# Patient Record
Sex: Female | Born: 1980 | Hispanic: Yes | Marital: Married | Smoking: Never smoker
Health system: Southern US, Community
[De-identification: ages and names within clinical notes are randomized; demographics above are authoritative.]

## PROBLEM LIST (undated history)

## (undated) ENCOUNTER — Inpatient Hospital Stay (HOSPITAL_COMMUNITY): Payer: Self-pay

## (undated) DIAGNOSIS — I10 Essential (primary) hypertension: Secondary | ICD-10-CM

## (undated) DIAGNOSIS — J40 Bronchitis, not specified as acute or chronic: Secondary | ICD-10-CM

## (undated) HISTORY — PX: NO PAST SURGERIES: SHX2092

## (undated) HISTORY — DX: Bronchitis, not specified as acute or chronic: J40

## (undated) HISTORY — DX: Essential (primary) hypertension: I10

---

## 2002-05-19 ENCOUNTER — Encounter: Payer: Self-pay | Admitting: Obstetrics & Gynecology

## 2002-05-19 ENCOUNTER — Ambulatory Visit (HOSPITAL_COMMUNITY): Admission: RE | Admit: 2002-05-19 | Discharge: 2002-05-19 | Payer: Self-pay | Admitting: Obstetrics & Gynecology

## 2002-08-19 ENCOUNTER — Inpatient Hospital Stay (HOSPITAL_COMMUNITY): Admission: AD | Admit: 2002-08-19 | Discharge: 2002-08-22 | Payer: Self-pay | Admitting: Internal Medicine

## 2006-10-05 ENCOUNTER — Inpatient Hospital Stay (HOSPITAL_COMMUNITY): Admission: AD | Admit: 2006-10-05 | Discharge: 2006-10-07 | Payer: Self-pay | Admitting: Obstetrics and Gynecology

## 2008-09-06 ENCOUNTER — Ambulatory Visit: Payer: Self-pay | Admitting: Family

## 2008-09-06 ENCOUNTER — Inpatient Hospital Stay (HOSPITAL_COMMUNITY): Admission: AD | Admit: 2008-09-06 | Discharge: 2008-09-09 | Payer: Self-pay | Admitting: Obstetrics & Gynecology

## 2011-04-18 NOTE — H&P (Signed)
   NAMENoel Thornton                    ACCOUNT NO.:  [id number]   MEDICAL RECORD NO.:  [id number]                   PATIENT TYPE:  INP   LOCATION:  A417                                 FACILITY:  [facility]   PHYSICIAN:  Tilda Burrow, M.D.              DATE OF BIRTH:  1981/11/02   DATE OF ADMISSION:  08/19/2002  DATE OF DISCHARGE:                                HISTORY & PHYSICAL   ADMISSION DIAGNOSIS:  Pregnancy, 40-1/2 weeks' gestation, latent phase  labor.   HISTORY OF PRESENT ILLNESS:  This 29 year old Hispanic female, gravida 1,  para 0, LMP unknown with ultrasound-assigned EDC of 08/14/02 based on a late  second trimester ultrasound, is admitted after a pregnancy course followed  only since June through our office after initial presentation at 27 weeks by  ultrasound criteria.  She presents with mild contractions, cervical dilation  to 2 cm, 75% effaced, -1 to -2 station, and vertex presentation, the cervix  slightly deviated to the left.  Vertex presentation confirmed, membranes  intact.  The patient is admitted for labor management.   Prenatal course followed x3 months with a seven-pound weight gain,  appropriate fundal height growth.   PAST MEDICAL HISTORY:  Allegedly quite benign.  Communication is  challenging, as patient and family completely speak Spanish only.  Past  medical history benign.   PAST SURGICAL HISTORY:  Negative.   ALLERGIES:  No known drug allergies.   PRENATAL LABORATORY DATA:  Blood type O positive, rubella immunity present.  Hemoglobin 11, hematocrit 33.  Hepatitis, HIV, RPR, GC, Chlamydia all  negative.  Group B strep negative.  Glucose tolerance test 98 mg%.   PHYSICAL EXAMINATION:  VITAL SIGNS:  Approximately 5 feet, weight 129, blood  pressure 120/80.  ABDOMEN:  Fundal height 36 cm, estimated fetal weight 6 pounds.  PELVIC:  Cervix 2 cm, 80%, -2, deep, and cervix deviated to the left when  examined by me shortly after midnight.   PLAN:  Admit, amniotomy.  May require Pitocin augmentation of labor.  Good  prognosis for vaginal delivery.                                               Tilda Burrow, M.D.    JVF/MEDQ  D:  08/20/2002  T:  08/20/2002  Job:  [id number]

## 2011-04-18 NOTE — H&P (Signed)
NAMEClent Thornton        ACCOUNT NO.:  [id number]   MEDICAL RECORD NO.:  [id number]          PATIENT TYPE:  INP   LOCATION:  LDR4                          FACILITY:  [facility]   PHYSICIAN:  Desiree Thornton, M.D. DATE OF BIRTH:  [birth year]   DATE OF ADMISSION:  10/05/2006  DATE OF DISCHARGE:  LH                                HISTORY & PHYSICAL   REASON FOR ADMISSION:  Pregnancy at 41 weeks and 2 days, early labor.   HISTORY OF PRESENT ILLNESS:  Desiree Thornton is admitted having irregular uterine  contractions and watched during the course of the night, which contractions  have progressively gotten stronger.  She is now, at 7:30, 4-5 cm, completely  effaced and -1 to 0 station.   ALLERGIES:  NO KNOWN DRUG ALLERGIES.   PAST MEDICAL HISTORY:  Negative.   PAST SURGICAL HISTORY:  Negative.   FAMILY HISTORY:  Benign.   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   Blood type is O positive.  UDS is negative.  Rubella is immune.  Hepatitis B  surface antigen negative.  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  Serology is  nonreactive. Pap normal. GC and Chlamydia on both cultures are negative.  GBS is negative.  A 28-week hemoglobin 11.3, a 28-week hematocrit is 35.  One hour glucose was 81.   PHYSICAL EXAMINATION:  Vital signs are stable.  Fetal heart rate pattern is  stable with reactivity.  Contractions are every two minutes, moderate to  strong intensity.  She is 4-5 cm, completely effaced, -1 to 0 station.  Also  perineum was inspected prior to rupture and no noted lesions as far as  herpes is observed.  __scalp electrode________ was applied.   PLAN:  Expect vaginal delivery.      Desiree Thornton, Desiree Thornton      Desiree Thornton, M.D.  Electronically Signed    DL/MEDQ  D:  34/74/2595  T:  10/06/2006  Job:  [id number]   cc:   Desiree Picket A. Gerda Diss, MD  Fax: [phone]

## 2011-04-18 NOTE — Group Therapy Note (Signed)
NAMELuanna Cole NO.:  [id number]   MEDICAL RECORD NO.:  [id number]          PATIENT TYPE:  INP   LOCATION:  A401                          FACILITY:  [facility]   PHYSICIAN:  Tilda Burrow, M.D. DATE OF BIRTH:  11/07/1981   DATE OF PROCEDURE:  DATE OF DISCHARGE:                                   PROGRESS NOTE   DELIVERY NOTE:  I checked Lyndsey at approximately 0850, and she was noted  to be fully dilated at zero to +1 station.  Her husband had gone to take her  little boy to school, so she did not want to push until he got back.  About  30 minutes later amazingly he did come back and she began to push.  After a  brief second stage she had a spontaneous vaginal delivery of a viable female  infant at 16.  The mouth and nose were suctioned on the perineum, and the  body delivered very rapidly.  Weight is 7 pounds 0 ounces, Apgars are 9 and  9.  Twenty units of Pitocin diluted in 1000 mL of lactated Ringer's was  infused rapidly IV.  The placenta separated spontaneously and delivered via  controlled cord traction at 0940.  It was inspected and appeared to be  intact with a three-vessel cord.  Estimated blood loss 200 mL.  The vagina  was inspected and no lacerations were found.      Jacklyn Shell, C.N.M.      Tilda Burrow, M.D.  Electronically Signed    FC/MEDQ  D:  10/06/2006  T:  10/07/2006  Job:  [id number]   cc:   [facility]   Dr. Gerda Diss

## 2011-04-18 NOTE — Op Note (Signed)
   NAMENoel Thornton                    ACCOUNT NO.:  [id number]   MEDICAL RECORD NO.:  [id number]                   PATIENT TYPE:  INP   LOCATION:  A417                                 FACILITY:  [facility]   PHYSICIAN:  Tilda Burrow, M.D.              DATE OF BIRTH:  December 10, 1980   DATE OF PROCEDURE:  DATE OF DISCHARGE:                                 OPERATIVE REPORT   LABOR SUMMARY AND DELIVERY NOTE:   TIME OF DELIVERY:  7:37 a.m.   SURGEON:  Tilda Burrow, M.D.   DESCRIPTION OF PROCEDURE:  The patient progressed slowly in labor.  Contraction intensity was considered inadequate for a prolonged latent phase  where at 2 a.m. There was no change in her cervix.  Contractions were  frequent but indentable.  Pitocin augmentation of labor was initiated.  She  progressed steadily through the night and required IV analgesics with  Phenergan and Nubain and progressed to completely dilated at 6:30 p.m.  She  pushed through a second stage of just slightly less than an hour and  delivered at apparatus 7:37 a.m. over an intact perineum, delivering a  healthy appearing female infant.  Apgars 9 and 9, weight 7 pounds 5.5 ounces  (3350 grams).  Amniotic fluid was clear.  The patient was delivered over an  intact perineum with no lacerations occurring.  Placental cord blood samples  were obtained and then the placenta delivered [facility] presentation.  There  was a tendency towards excess uterine bleeding with approximately 750 cc  blood loss primarily occurring with delivery of the placenta, but she had 1  episode of uterine atony shortly after delivery of the placenta which  responded to intramuscular Hemabate to 125 mcg.  The patient then remained  stable with labor attended by the patient's husband who was supportive.                                               Tilda Burrow, M.D.    JVF/MEDQ  D:  08/20/2002  T:  08/22/2002  Job:  [id number]   cc:   Dr. [facility]

## 2011-09-02 LAB — DIFFERENTIAL
Basophils Absolute: 0.1
Basophils Relative: 1
Eosinophils Absolute: 0.1
Eosinophils Relative: 1
Lymphocytes Relative: 25
Lymphs Abs: 2.8
Monocytes Absolute: 0.7
Monocytes Relative: 7
Neutro Abs: 7.4
Neutrophils Relative %: 66

## 2011-09-02 LAB — CBC
HCT: 35.3 — ABNORMAL LOW
Hemoglobin: 11.4 — ABNORMAL LOW
MCHC: 32.4
MCV: 84.6
Platelets: 300
RBC: 4.18
RDW: 15.9 — ABNORMAL HIGH
WBC: 11.1 — ABNORMAL HIGH

## 2011-09-02 LAB — RUBELLA SCREEN: Rubella: 32.9 — ABNORMAL HIGH

## 2011-09-02 LAB — ABO/RH: ABO/RH(D): O POS

## 2011-09-02 LAB — RPR: RPR Ser Ql: NONREACTIVE

## 2011-09-02 LAB — HEPATITIS B SURFACE ANTIGEN: Hepatitis B Surface Ag: NEGATIVE

## 2013-04-14 ENCOUNTER — Ambulatory Visit (HOSPITAL_COMMUNITY)
Admission: RE | Admit: 2013-04-14 | Discharge: 2013-04-14 | Disposition: A | Discharge status: Home / Self Care | Payer: Self-pay | Source: Ambulatory Visit | Attending: Family Medicine | Admitting: Family Medicine

## 2013-04-14 ENCOUNTER — Other Ambulatory Visit (HOSPITAL_COMMUNITY): Payer: Self-pay | Admitting: Nurse Practitioner

## 2013-04-14 DIAGNOSIS — R059 Cough, unspecified: Secondary | ICD-10-CM | POA: Insufficient documentation

## 2013-04-14 DIAGNOSIS — R05 Cough: Secondary | ICD-10-CM

## 2013-09-04 ENCOUNTER — Inpatient Hospital Stay (HOSPITAL_COMMUNITY): Payer: Self-pay

## 2013-09-04 ENCOUNTER — Inpatient Hospital Stay (HOSPITAL_COMMUNITY)
Admission: AD | Admit: 2013-09-04 | Discharge: 2013-09-04 | Disposition: A | Discharge status: Home / Self Care | Payer: Self-pay | Source: Ambulatory Visit | Attending: Obstetrics & Gynecology | Admitting: Obstetrics & Gynecology

## 2013-09-04 ENCOUNTER — Encounter (HOSPITAL_COMMUNITY): Payer: Self-pay | Admitting: *Deleted

## 2013-09-04 DIAGNOSIS — O039 Complete or unspecified spontaneous abortion without complication: Secondary | ICD-10-CM | POA: Insufficient documentation

## 2013-09-04 LAB — POCT PREGNANCY, URINE: Preg Test, Ur: POSITIVE — AB

## 2013-09-04 LAB — URINE MICROSCOPIC-ADD ON

## 2013-09-04 LAB — CBC
MCH: 30 pg (ref 26.0–34.0)
Platelets: 304 10*3/uL (ref 150–400)
RBC: 4.43 MIL/uL (ref 3.87–5.11)
WBC: 6.8 10*3/uL (ref 4.0–10.5)

## 2013-09-04 LAB — URINALYSIS, ROUTINE W REFLEX MICROSCOPIC
Bilirubin Urine: NEGATIVE
Ketones, ur: NEGATIVE mg/dL
Protein, ur: NEGATIVE mg/dL
Urobilinogen, UA: 0.2 mg/dL (ref 0.0–1.0)

## 2013-09-04 LAB — WET PREP, GENITAL: Clue Cells Wet Prep HPF POC: NONE SEEN

## 2013-09-04 LAB — HCG, QUANTITATIVE, PREGNANCY: hCG, Beta Chain, Quant, S: 2683 m[IU]/mL — ABNORMAL HIGH (ref <5)

## 2013-09-04 NOTE — MAU Note (Signed)
Pt presents with complaints of vaginal bleeding since this morning around 4 am. States some abdominal cramping

## 2013-09-04 NOTE — MAU Provider Note (Signed)
History     CSN: [id number]  Arrival date and time: 09/04/13 1227   None     Chief Complaint  Patient presents with  . Vaginal Bleeding   HPI 32 y.o. G4P3 at [redacted]w[redacted]d by LMP with bleeding since 9 am, mild pain in low abd and left side. Found out she was pregnant about 2 months ago.   History reviewed. No pertinent past medical history.  History reviewed. No pertinent past surgical history.  No family history on file.  History  Substance Use Topics  . Smoking status: Not on file  . Smokeless tobacco: Not on file  . Alcohol Use: Not on file    Allergies: Allergies not on file  No prescriptions prior to admission    Review of Systems  Constitutional: Negative.   Respiratory: Negative.   Cardiovascular: Negative.   Gastrointestinal: Positive for abdominal pain. Negative for nausea, vomiting, diarrhea and constipation.  Genitourinary: Negative for dysuria, urgency, frequency, hematuria and flank pain.       + bleeding  Musculoskeletal: Negative.   Neurological: Negative.   Psychiatric/Behavioral: Negative.    Physical Exam   Blood pressure 134/85, pulse 72, temperature 98.2 F (36.8 C), temperature source Oral, resp. rate 16, height 4\' 11"  (1.499 m), weight 138 lb (62.596 kg), last menstrual period 06/03/2013.  Physical Exam  Nursing note and vitals reviewed. Constitutional: She is oriented to person, place, and time. She appears well-developed and well-nourished. No distress.  HENT:  Head: Normocephalic and atraumatic.  Cardiovascular: Normal rate.   Respiratory: Effort normal. No respiratory distress.  GI: Soft. She exhibits no distension and no mass. There is no tenderness. There is no rebound and no guarding.  Genitourinary: There is no rash or lesion on the right labia. There is no rash or lesion on the left labia. Uterus is not deviated, not fixed and not tender. Cervix exhibits no motion tenderness, no discharge and no friability. Right adnexum displays no  mass, no tenderness and no fullness. Left adnexum displays no mass, no tenderness and no fullness. There is bleeding (small) around the vagina. No erythema or tenderness around the vagina. No vaginal discharge found.  Exam limited by body habitus. Cervix closed  Neurological: She is alert and oriented to person, place, and time.  Skin: Skin is warm and dry.  Psychiatric: She has a normal mood and affect.   Unable to doppler FHT.   MAU Course  Procedures  Results for orders placed during the hospital encounter of 09/04/13 (from the past 24 hour(s))  URINALYSIS, ROUTINE W REFLEX MICROSCOPIC     Status: Abnormal   Collection Time    09/04/13 12:45 PM      Result Value Range   Color, Urine RED (*) YELLOW   APPearance CLEAR  CLEAR   Specific Gravity, Urine <1.005 (*) 1.005 - 1.030   pH 6.5  5.0 - 8.0   Glucose, UA NEGATIVE  NEGATIVE mg/dL   Hgb urine dipstick LARGE (*) NEGATIVE   Bilirubin Urine NEGATIVE  NEGATIVE   Ketones, ur NEGATIVE  NEGATIVE mg/dL   Protein, ur NEGATIVE  NEGATIVE mg/dL   Urobilinogen, UA 0.2  0.0 - 1.0 mg/dL   Nitrite NEGATIVE  NEGATIVE   Leukocytes, UA NEGATIVE  NEGATIVE  URINE MICROSCOPIC-ADD ON     Status: Abnormal   Collection Time    09/04/13 12:45 PM      Result Value Range   Squamous Epithelial / LPF MANY (*) RARE   WBC, UA 3-6  <  3 WBC/hpf   RBC / HPF 21-50  <3 RBC/hpf   Bacteria, UA RARE  RARE   Urine-Other MUCOUS PRESENT    POCT PREGNANCY, URINE     Status: Abnormal   Collection Time    09/04/13 12:52 PM      Result Value Range   Preg Test, Ur POSITIVE (*) NEGATIVE  CBC     Status: None   Collection Time    09/04/13  1:15 PM      Result Value Range   WBC 6.8  4.0 - 10.5 K/uL   RBC 4.43  3.87 - 5.11 MIL/uL   Hemoglobin 13.3  12.0 - 15.0 g/dL   HCT 09.8  11.9 - 14.7 %   MCV 87.8  78.0 - 100.0 fL   MCH 30.0  26.0 - 34.0 pg   MCHC 34.2  30.0 - 36.0 g/dL   RDW 82.9  56.2 - 13.0 %   Platelets 304  150 - 400 K/uL    US Ob Comp Less 14  Wks  09/04/2013   *RADIOLOGY REPORT*  Clinical Data: Vaginal bleeding, last menstrual period - 06/03/2013; evaluate for ectopic pregnancy; B-HCG - 2600  OBSTETRIC <14 WK ULTRASOUND  Technique:  Transabdominal ultrasound was performed for evaluation of the gestation as well as the maternal uterus and adnexal regions.  Comparison:  None.  Findings:  Intrauterine gestational sac: Single normal appearing intrauterine gestational sac Yolk sac: Not visualized Embryo: Visualized Cardiac Activity: Not visualized  CRL:  24 mm  9 w  2 d  Maternal uterus/Adnexae:  Right ovary:  Normal in size measuring 3.1 x 1.4 x 2.0 cm.  No discrete right-sided ovarian or adnexal mass.  Left ovary:  Normal in size measuring approximately 0.2 x 2.8 x 3.1 cm.  The left ovary demonstrates increased echogenicity possibly representative of a dermoid.  No free fluid in the pelvic cul-de-sac.  IMPRESSION: 1.  Single intrauterine gestation with crown rump length compatible with a 9-week, 2-day gestation, however no identifiable cardiac activity or heart rate - findings worrisome for failed intrauterine gestation. 2.  Abnormal diffuse increased echogenicity of a normal sized left ovary worrisome for the possibility of a left ovarian dermoid. Further evaluation may be obtained with a followup pelvic ultrasound in 4 to 6 weeks or with a non emergent pelvic MRI.  Critical Value/emergent results were called by telephone at the time of interpretation on 09/04/2013 at 40 27 to Dr. Bascom Levels, who verbally acknowledged these results.   Original Report Authenticated By: Tacey Ruiz, MD   Assessment and Plan   1. SAB (spontaneous abortion)   Counseled patient on options - expectant mgmt vs. cytotec - pt elects expectant mgmt at this time, will f/u in [facility] in 1 week. Rev'd precautions.     Medication List         prenatal multivitamin Tabs tablet  Take 1 tablet by mouth daily at 12 noon.            Follow-up Information   Follow up with  [facility]. (The clinic will call you to schedule an appointment )    Specialty:  Obstetrics and Gynecology   Contact information:   [address] [place] [address] [phone]       [facility] 09/04/2013, 1:08 PM

## 2013-09-05 LAB — GC/CHLAMYDIA PROBE AMP: GC Probe RNA: NEGATIVE

## 2013-09-05 NOTE — MAU Provider Note (Signed)
Attestation of Attending Supervision of Advanced Practitioner (CNM/NP): Evaluation and management procedures were performed by the Advanced Practitioner under my supervision and collaboration. I have reviewed the Advanced Practitioner's note and chart, and I agree with the management and plan.  Vallie Fayette H. 7:12 AM   

## 2013-09-15 ENCOUNTER — Encounter: Payer: Self-pay | Admitting: Family Medicine

## 2013-09-15 ENCOUNTER — Ambulatory Visit (INDEPENDENT_AMBULATORY_CARE_PROVIDER_SITE_OTHER): Payer: Self-pay | Admitting: Family Medicine

## 2013-09-15 VITALS — BP 134/90 | HR 89 | Temp 97.7°F | Ht 59.0 in | Wt 139.6 lb

## 2013-09-15 DIAGNOSIS — O039 Complete or unspecified spontaneous abortion without complication: Secondary | ICD-10-CM

## 2013-09-15 DIAGNOSIS — N83209 Unspecified ovarian cyst, unspecified side: Secondary | ICD-10-CM

## 2013-09-15 NOTE — Patient Instructions (Signed)
Aborto espontneo  (Miscarriage) El aborto espontneo es la prdida de un beb que no ha nacido (feto) antes de la semana 20 del Media planner. La mayor parte de estos abortos ocurre en los primeros 3 meses. En algunos casos ocurre antes de que la mujer sepa que est [place]. Tambin se denomina "aborto espontneo" o "prdida prematura del embarazo". El aborto espontneo puede ser Ardelia Mems experiencia que afecte emocionalmente a Geologist, engineering. Converse con su mdico si tiene dudas, cmo es el proceso de [place], y sobre planes futuros de Media planner.  CAUSAS   Algunos problemas cromosmicos pueden hacer imposible que el beb se desarrolle normalmente. Los problemas con los genes o cromosomas del beb son generalmente el resultado de errores que se producen, por casualidad, cuando el embrin se divide y crece. Estos problemas no se heredan de los [place].  Infeccin en el cuello del tero.   Problemas hormonales.   Problemas en el cuello del tero, como tener un tero incompetente. Esto ocurre cuando los tejidos no son lo suficientemente fuertes como para Risk manager.   Problemas del tero, como un tero con forma anormal, los fibromas o anormalidades congnitas.   Ciertas enfermedades crnicas.   No fume, no beba alcohol, ni consuma drogas.   Traumatismos  A veces, la causa es desconocida.  SNTOMAS   Sangrado o manchado vaginal, con o sin clicos o dolor.  Dolor o clicos en el abdomen o en la cintura.  Eliminacin de lquido, tejidos o cogulos grandes por la vagina. DIAGNSTICO  El [employer] har un examen fsico. Tambin le indicar una ecografa para confirmar el aborto. Es posible que se realicen anlisis de [place].  TRATAMIENTO   En algunos casos el tratamiento no es necesario, si se eliminan naturalmente todos los tejidos embrionarios que se encontraban en el tero. Si el feto o la placenta quedan dentro del tero (aborto incompleto), pueden infectarse, los tejidos que quedan  pueden infectarse y deben retirarse. Generalmente se realiza un procedimiento de dilatacin y curetaje (D y C). Durante el procedimiento de dilatacin y curetaje, el cuello del tero se abre (dilata) y se retira cualquier resto de tejido fetal o placentario del tero.  Si hay una infeccin, le recetarn antibiticos. Podrn recetarle otros medicamentos para reducir el tamao del tero (contraerlo) si hay una mucho sangrado.  Si su sangre es Rh negativa y su beb es Rh positivo, usted necesitar la inyeccin de inmunoglobulina Rh. Esta inyeccin proteger a los futuros bebs de tener problemas de compatibilidad Rh en futuros embarazos. INSTRUCCIONES PARA EL CUIDADO EN EL HOGAR   El mdico le indicar reposo en cama o le permitir Automotive engineer. Vuelva a la actividad lentamente o segn las indicaciones de su mdico.  Pdale a alguien que la ayude con las responsabilidades familiares y del hogar durante este tiempo.   Lleve un registro de la cantidad y la saturacin de las toallas higinicas que Medical laboratory scientific officer. Anote esta informacin   No use tampones. No No se haga duchas vaginales ni tenga relaciones sexuales hasta que el mdico la autorice.   Slo tome medicamentos de venta libre o recetados para Glass blower/designer o Health and safety inspector, segn las indicaciones de su mdico.   No tome aspirina. La aspirina puede ocasionar hemorragias.   Concurra puntualmente a las citas de control con el mdico.   Si usted o su pareja tienen dificultades con el duelo, hable con su mdico para buscar la ayuda psicolgica que los ayude a enfrentar la prdida  del embarazo. Permtase el tiempo suficiente de duelo antes de quedar embarazada nuevamente.  SOLICITE ATENCIN MDICA DE INMEDIATO SI:   Siente calambres intensos o dolor en la espalda o en el abdomen.  Tiene fiebre.  Elimina grandes cogulos de [place] (del tamao de una nuez o ms) o tejidos por la vagina. Guarde lo que ha eliminado para  que su mdico lo examine.   La hemorragia aumenta.   Brett Fairy secrecin vaginal espesa y con mal olor.  Se siente mareada, dbil, o se desmaya.   Siente escalofros.  ASEGRESE DE QUE:   Comprende estas instrucciones.  Controlar su enfermedad.  Solicitar ayuda de inmediato si no mejora o si empeora. Document Released: 08/27/2005 Document Revised: 05/18/2012 [facility] Patient Information 2014 [place], [place].

## 2013-09-15 NOTE — Progress Notes (Signed)
GYN clinic visit    Chief Complaint:  Miscarriage   Desiree Thornton is  32 y.o. [phone].  Patient's last menstrual period was 06/03/2013..   She presents complaining of Miscarriage . Pt presented 10/5 to the MAU with vaginal bleeding.  At that time US showed  1. Single intrauterine gestation with crown rump length compatible  with a 9-week, 2-day gestation, however no identifiable cardiac  activity or heart rate - findings worrisome for failed intrauterine  gestation.  2. Abnormal diffuse increased echogenicity of a normal sized left  ovary worrisome for the possibility of a left ovarian dermoid.  Further evaluation may be obtained with a followup pelvic  ultrasound in 4 to 6 weeks or with a non emergent pelvic MRI.  Pt was give options for medical vs. Observational management of miscarriage and opted for conservative management. Says she continued bleeding until the last couple days and now has slowed down.  Initially was changing 2 pads a day and is unsure if she passed any clots because she was afraid to look. Now just having brown discharge.   No fevers, chills, abd pain, nausea, vomiting, diarrhea, constipation.   OB History   Grav Para Term Preterm Abortions TAB SAB Ect Mult Living   4 3 3  1  1   3        Past Medical History  Diagnosis Date  . Hypertension     History reviewed. No pertinent past surgical history.  History reviewed. No pertinent family history.  History  Substance Use Topics  . Smoking status: Never Smoker   . Smokeless tobacco: Never Used  . Alcohol Use: No    Allergies: No Known Allergies   (Not in a hospital admission)   Review of Systems  Review of Systems  Constitutional: Negative for fever, chills, weight loss, malaise/fatigue and diaphoresis.  HENT: Negative for hearing loss, ear pain, nosebleeds, congestion, sore throat, neck pain, tinnitus and ear discharge.   Eyes: Negative for blurred vision, double vision,  photophobia, pain, discharge and redness.  Respiratory: Negative for cough, hemoptysis, sputum production, shortness of breath, wheezing and stridor.   Cardiovascular: Negative for chest pain, palpitations, orthopnea,  leg swelling  Gastrointestinal: Negative for abdominal pain heartburn, nausea, vomiting, diarrhea, constipation, blood in stool Genitourinary: Negative for dysuria, urgency, frequency, hematuria and flank pain.  Musculoskeletal: Negative for myalgias, back pain, joint pain and falls.  Skin: Negative for itching and rash.  Neurological: Negative for dizziness, tingling, tremors, sensory change, speech change, focal weakness, seizures, loss of consciousness, weakness and headaches.  Endo/Heme/Allergies: Negative for environmental allergies and polydipsia. Does not bruise/bleed easily.  Psychiatric/Behavioral: Negative for depression, suicidal ideas, hallucinations, memory loss and substance abuse. The patient is not nervous/anxious and does not have insomnia.      Physical Exam   Blood pressure 134/90, pulse 89, temperature 97.7 F (36.5 C), height 4\' 11"  (1.499 m), weight 139 lb 9.6 oz (63.322 kg), last menstrual period 06/03/2013, unknown if currently breastfeeding.  General: General appearance - alert, well appearing, and in no distress Chest - clear to auscultation, no wheezes, rales or rhonchi, symmetric air entry Heart - normal rate, regular rhythm, normal S1, S2, no murmurs, rubs, clicks or gallops Abdomen - soft, nontender, nondistended, no masses or organomegaly Pelvic - normal external genitalia, vulva, vagina, cervix, uterus and adnexa, cervix closed. Some dried blood in the vaginal vault.  Extremities - peripheral pulses normal, no pedal edema, no clubbing or cyanosis   Labs: No results found  for this or any previous visit (from the past 24 hour(s)). Imaging Studies:  US Ob Comp Less 14 Wks  09/04/2013   *RADIOLOGY REPORT*  Clinical Data: Vaginal bleeding, last  menstrual period - 06/03/2013; evaluate for ectopic pregnancy; B-HCG - 2600  OBSTETRIC <14 WK ULTRASOUND  Technique:  Transabdominal ultrasound was performed for evaluation of the gestation as well as the maternal uterus and adnexal regions.  Comparison:  None.  Findings:  Intrauterine gestational sac: Single normal appearing intrauterine gestational sac Yolk sac: Not visualized Embryo: Visualized Cardiac Activity: Not visualized  CRL:  24 mm  9 w  2 d  Maternal uterus/Adnexae:  Right ovary:  Normal in size measuring 3.1 x 1.4 x 2.0 cm.  No discrete right-sided ovarian or adnexal mass.  Left ovary:  Normal in size measuring approximately 0.2 x 2.8 x 3.1 cm.  The left ovary demonstrates increased echogenicity possibly representative of a dermoid.  No free fluid in the pelvic cul-de-sac.  IMPRESSION: 1.  Single intrauterine gestation with crown rump length compatible with a 9-week, 2-day gestation, however no identifiable cardiac activity or heart rate - findings worrisome for failed intrauterine gestation. 2.  Abnormal diffuse increased echogenicity of a normal sized left ovary worrisome for the possibility of a left ovarian dermoid. Further evaluation may be obtained with a followup pelvic ultrasound in 4 to 6 weeks or with a non emergent pelvic MRI.  Critical Value/emergent results were called by telephone at the time of interpretation on 09/04/2013 at 40 27 to Dr. Bascom Levels, who verbally acknowledged these results.   Original Report Authenticated By: Tacey Ruiz, MD     Assessment: SAB (spontaneous abortion) - Plan: hCG, quantitative, pregnancy   Plan: - story consistent likely with completed abortion - cervix now closed and uterus normal size.  - will check HCG today to ensure has resolved - no need at this time for d&C - symptoms to return discussed.   Also, will repeat US in 6 weeks given concern for possible dermoid cyst on [place] as above.    Gemini Bunte L

## 2013-09-22 ENCOUNTER — Inpatient Hospital Stay (HOSPITAL_COMMUNITY): Payer: Self-pay

## 2013-09-22 ENCOUNTER — Encounter (HOSPITAL_COMMUNITY): Payer: Self-pay | Admitting: *Deleted

## 2013-09-22 ENCOUNTER — Inpatient Hospital Stay (HOSPITAL_COMMUNITY)
Admission: AD | Admit: 2013-09-22 | Discharge: 2013-09-22 | Disposition: A | Discharge status: Home / Self Care | Payer: Self-pay | Source: Ambulatory Visit | Attending: Obstetrics & Gynecology | Admitting: Obstetrics & Gynecology

## 2013-09-22 DIAGNOSIS — O036 Delayed or excessive hemorrhage following complete or unspecified spontaneous abortion: Secondary | ICD-10-CM | POA: Insufficient documentation

## 2013-09-22 DIAGNOSIS — O031 Delayed or excessive hemorrhage following incomplete spontaneous abortion: Secondary | ICD-10-CM

## 2013-09-22 LAB — CBC
HCT: 36.2 % (ref 36.0–46.0)
MCV: 89.2 fL (ref 78.0–100.0)
RDW: 13.3 % (ref 11.5–15.5)
WBC: 8.7 10*3/uL (ref 4.0–10.5)

## 2013-09-22 LAB — TYPE AND SCREEN

## 2013-09-22 LAB — HCG, QUANTITATIVE, PREGNANCY: hCG, Beta Chain, Quant, S: 105 m[IU]/mL — ABNORMAL HIGH (ref <5)

## 2013-09-22 MED ORDER — PROMETHAZINE HCL 25 MG PO TABS: 25.0000 mg | ORAL_TABLET | Freq: Four times a day (QID) | ORAL | Status: DC | PRN

## 2013-09-22 MED ORDER — HYDROCODONE-ACETAMINOPHEN 5-325 MG PO TABS: 1.0000 | ORAL_TABLET | Freq: Four times a day (QID) | ORAL | Status: DC | PRN

## 2013-09-22 MED ORDER — MISOPROSTOL 200 MCG PO TABS
800.0000 ug | ORAL_TABLET | Freq: Once | ORAL | Status: AC
  Administered 2013-09-22: 800 ug via VAGINAL
  Filled 2013-09-22: qty 4

## 2013-09-22 NOTE — MAU Provider Note (Signed)
History     CSN: [id number]  Arrival date and time: 09/22/13 1346   None     Chief Complaint  Patient presents with  . Vaginal Bleeding   HPI This is a 32 y.o. female who is 2 weeks s/p missed abortion who presents via EMS with heavy bleeding that started today at noon. Also having cramps with it. Feels dizzy and weak. Was seen last week in clinic and reported only brown spotting and no pain. Previous US showed IUFD.  Denies fever.   RN Note: Pt brought in by EMS, had miscarriage 2 weeks ago, had sudden onset of bleeding today @ 1200. Pt clothes, pad & sheet completely saturated with blood. Pt states she had been bleeding like a period for the last 2 weeks.       OB History   Grav Para Term Preterm Abortions TAB SAB Ect Mult Living   4 3 3  1  1   3       Past Medical History  Diagnosis Date  . Hypertension     Past Surgical History  Procedure Laterality Date  . No past surgeries      History reviewed. No pertinent family history.  History  Substance Use Topics  . Smoking status: Never Smoker   . Smokeless tobacco: Never Used  . Alcohol Use: No    Allergies: No Known Allergies  Prescriptions prior to admission  Medication Sig Dispense Refill  . Prenatal Vit-Fe Fumarate-FA (PRENATAL MULTIVITAMIN) TABS tablet Take 1 tablet by mouth daily at 12 noon.        Review of Systems  Constitutional: Negative for fever and chills.  Gastrointestinal: Positive for abdominal pain. Negative for nausea and vomiting.  Genitourinary:       Heavy vaginal bleeding   Neurological: Positive for weakness.   Physical Exam   Blood pressure 143/90, pulse 66, temperature 97.9 F (36.6 C), temperature source Oral, resp. rate 20, last menstrual period 06/03/2013, SpO2 100.00%, not currently breastfeeding.  Physical Exam  Constitutional: She is oriented to person, place, and time. She appears well-developed and well-nourished. No distress.  HENT:  Head: Normocephalic.   Cardiovascular: Normal rate and normal heart sounds.  Exam reveals no gallop and no friction rub.   No murmur heard. Respiratory: Effort normal. No respiratory distress. She has no wheezes. She has no rales.  GI: Soft. She exhibits no distension. There is tenderness (over uterus). There is no rebound and no guarding.  Genitourinary: Uterus normal. Vaginal discharge (large amount of clotted blood in vault. Ring forceps used to retrieve these and tissue. abuot 250-350cc of blood clot and tissue retrieved. ) found.  Clot felt inside os, so will send for [place] to r/o Retained POC   Musculoskeletal: Normal range of motion.  Neurological: She is alert and oriented to person, place, and time.  Skin: Skin is warm and dry.  Psychiatric: She has a normal mood and affect.    MAU Course  Procedures  MDM Results for orders placed during the hospital encounter of 09/22/13 (from the past 24 hour(s))  TYPE AND SCREEN     Status: None   Collection Time    09/22/13  2:06 PM      Result Value Range   ABO/RH(D) O POS     Antibody Screen NEG     Sample Expiration 09/25/2013    CBC     Status: None   Collection Time    09/22/13  2:53 PM  Result Value Range   WBC 8.7  4.0 - 10.5 K/uL   RBC 4.06  3.87 - 5.11 MIL/uL   Hemoglobin 12.1  12.0 - 15.0 g/dL   HCT 82.9  56.2 - 13.0 %   MCV 89.2  78.0 - 100.0 fL   MCH 29.8  26.0 - 34.0 pg   MCHC 33.4  30.0 - 36.0 g/dL   RDW 86.5  78.4 - 69.6 %   Platelets 299  150 - 400 K/uL  HCG, QUANTITATIVE, PREGNANCY     Status: Abnormal   Collection Time    09/22/13  2:53 PM      Result Value Range   hCG, Beta Chain, Quant, S 105 (*) <5 mIU/mL   US Ob Comp Less 14 Wks  09/04/2013   *RADIOLOGY REPORT*  Clinical Data: Vaginal bleeding, last menstrual period - 06/03/2013; evaluate for ectopic pregnancy; B-HCG - 2600  OBSTETRIC <14 WK ULTRASOUND  Technique:  Transabdominal ultrasound was performed for evaluation of the gestation as well as the maternal uterus and  adnexal regions.  Comparison:  None.  Findings:  Intrauterine gestational sac: Single normal appearing intrauterine gestational sac Yolk sac: Not visualized Embryo: Visualized Cardiac Activity: Not visualized  CRL:  24 mm  9 w  2 d  Maternal uterus/Adnexae:  Right ovary:  Normal in size measuring 3.1 x 1.4 x 2.0 cm.  No discrete right-sided ovarian or adnexal mass.  Left ovary:  Normal in size measuring approximately 0.2 x 2.8 x 3.1 cm.  The left ovary demonstrates increased echogenicity possibly representative of a dermoid.  No free fluid in the pelvic cul-de-sac.  IMPRESSION: 1.  Single intrauterine gestation with crown rump length compatible with a 9-week, 2-day gestation, however no identifiable cardiac activity or heart rate - findings worrisome for failed intrauterine gestation. 2.  Abnormal diffuse increased echogenicity of a normal sized left ovary worrisome for the possibility of a left ovarian dermoid. Further evaluation may be obtained with a followup pelvic ultrasound in 4 to 6 weeks or with a non emergent pelvic MRI.  Critical Value/emergent results were called by telephone at the time of interpretation on 09/04/2013 at 40 27 to Dr. Bascom Levels, who verbally acknowledged these results.   Original Report Authenticated By: Tacey Ruiz, MD   US Ob Transvaginal  09/22/2013   CLINICAL DATA:  Heavy vaginal bleeding. Intrauterine fetal demise noted on 09/15/2013. At that time, no procedure was performed.  EXAM: TRANSVAGINAL OB ULTRASOUND; OBSTETRIC <14 WK ULTRASOUND  TECHNIQUE: Transvaginal ultrasound was performed for complete evaluation of the gestation as well as the maternal uterus, adnexal regions, and pelvic cul-de-sac.  COMPARISON:  Ultrasound 09/04/2013.  FINDINGS: Intrauterine gestational sac: None identified  Yolk sac:  None identified  Embryo:  None identified  Maternal uterus/adnexae: Right ovary has a normal appearance. The left ovary is not seen. The endometrium is thickened, heterogeneous, and  vascular on Doppler evaluation. The endometrium measures approximately 1.7 cm in thickness.  IMPRESSION: 1. Thickened endometrium with absent gestational sac. Findings are consistent with retained products of conception. 2. Previously, the left ovary was noted to be echogenic. However, the left ovary is not well seen today. For this reason, followup is recommended. Pelvic ultrasound is suggested in 4-6 weeks.   Electronically Signed   By: Rosalie Gums M.D.   On: 09/22/2013 15:49    Assessment and Plan  A:  SAB with most tissue passed (sent to pathology)       Some clot remains in uterus  Bleeding significantly subsided       Stable Hemoglobin  P:  Discussed with Dr Penne Lash       Cytotec placed by me into vagina       Discharge home with bleeding precautions. Advised she will pass more clots as they are expelled from uterus.          Medication List         HYDROcodone-acetaminophen 5-325 MG per tablet  Commonly known as:  NORCO  Take 1 tablet by mouth every 6 (six) hours as needed for pain.     prenatal multivitamin Tabs tablet  Take 1 tablet by mouth daily at 12 noon.     promethazine 25 MG tablet  Commonly known as:  PHENERGAN  Take 1 tablet (25 mg total) by mouth every 6 (six) hours as needed for nausea.            Followup inoffice   [facility] 09/22/2013, 2:09 PM

## 2013-09-22 NOTE — MAU Note (Signed)
Pt brought in by EMS, had miscarriage 2 weeks ago, had sudden onset of bleeding today @ 1200.  Pt clothes, pad & sheet completely saturated with blood.  Pt states she had been bleeding like a period for the last 2 weeks.

## 2013-09-27 NOTE — MAU Provider Note (Signed)
Attestation of Attending Supervision of Advanced Practitioner (CNM/NP): Evaluation and management procedures were performed by the Advanced Practitioner under my supervision and collaboration. I have reviewed the Advanced Practitioner's note and chart, and I agree with the management and plan.  Felder Lebeda H. 10:05 PM

## 2014-10-02 ENCOUNTER — Encounter (HOSPITAL_COMMUNITY): Payer: Self-pay | Admitting: *Deleted

## 2015-01-14 DIAGNOSIS — O139 Gestational [pregnancy-induced] hypertension without significant proteinuria, unspecified trimester: Secondary | ICD-10-CM | POA: Insufficient documentation

## 2015-07-30 IMAGING — US US OB COMP LESS 14 WK
1 series · 14 of 27 positions shown · non-contrast
Comparison: Ultrasound 09/04/2013.

CLINICAL DATA: Heavy vaginal bleeding. Intrauterine fetal demise
noted on 09/15/2013. At that time, no procedure was performed.

EXAM:
TRANSVAGINAL OB ULTRASOUND; OBSTETRIC <14 WK ULTRASOUND
TECHNIQUE: Transvaginal ultrasound was performed for complete evaluation of the
gestation as well as the maternal uterus, adnexal regions, and
pelvic cul-de-sac.

[Series 1: us ob comp less 14 wks · 27 acquisitions, 14 frames shown]
[im 1/27]
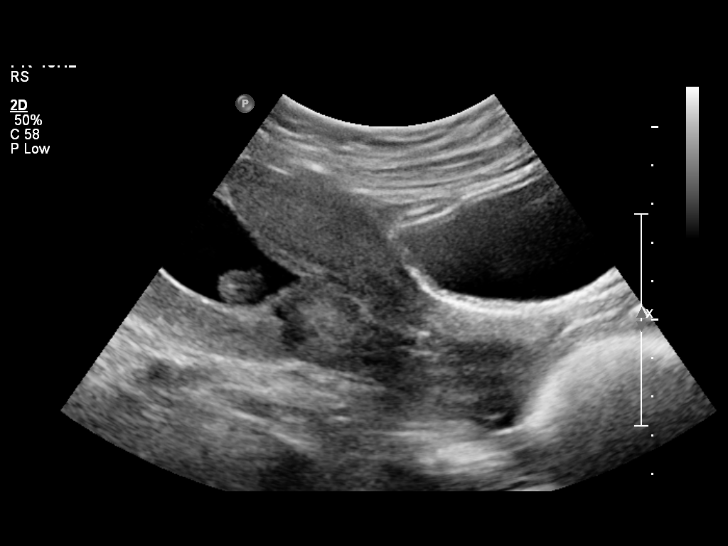
[im 3/27]
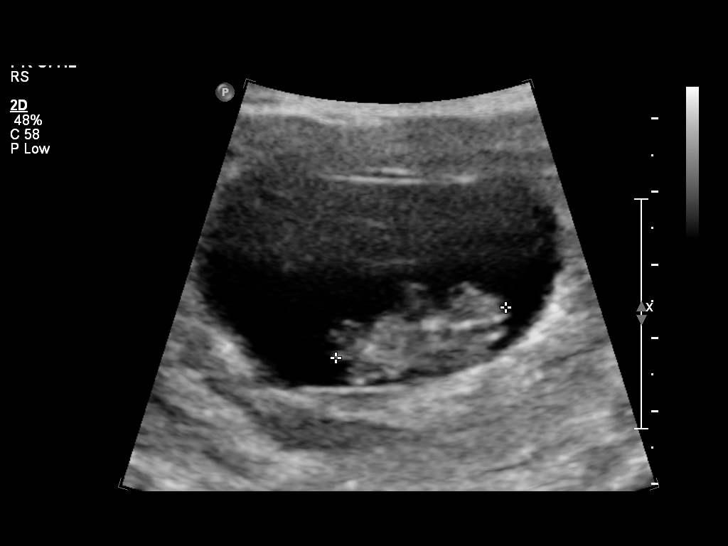
[im 5/27]
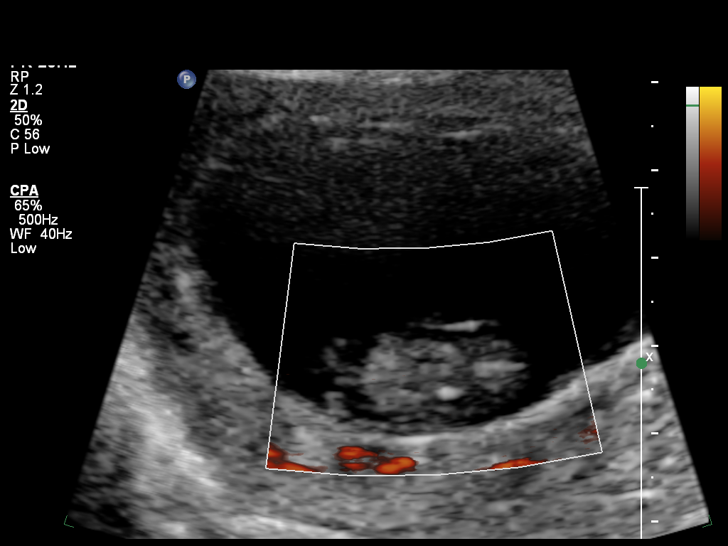
[im 7/27]
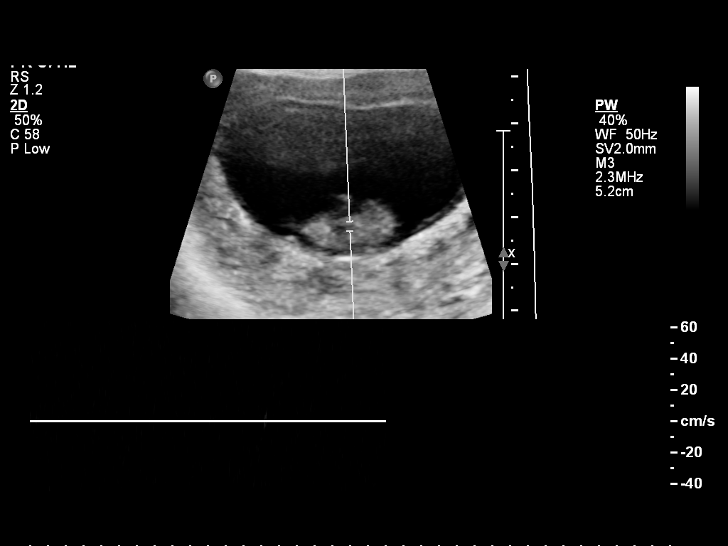
[im 9/27]
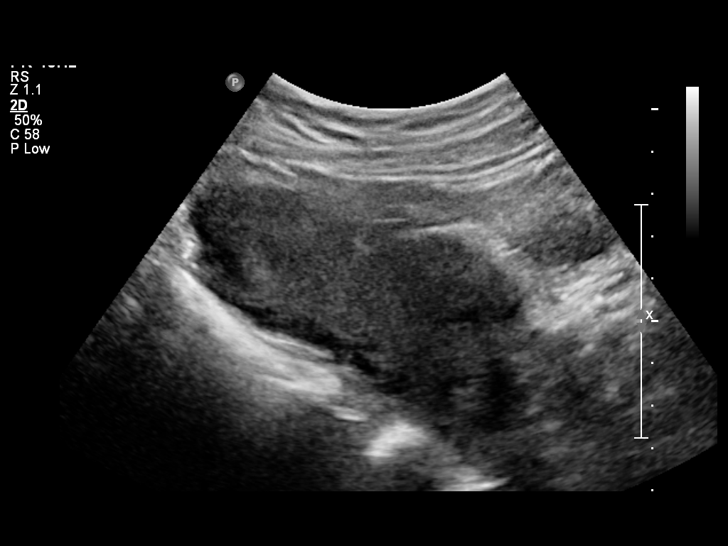
[im 11/27]
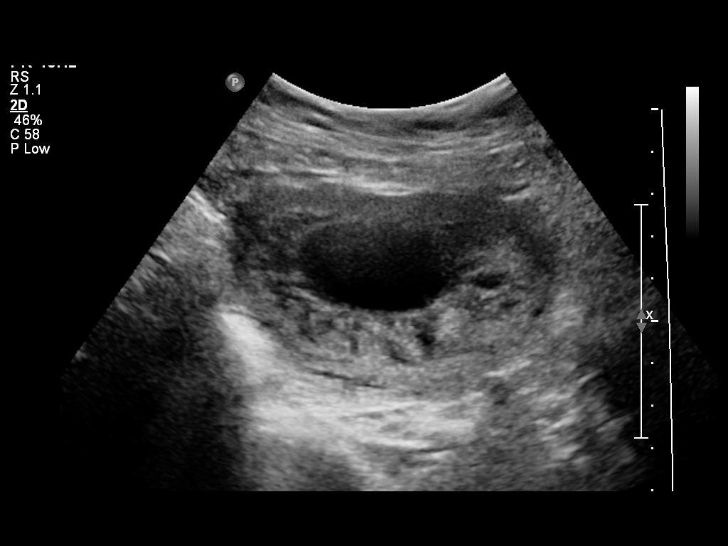
[im 13/27]
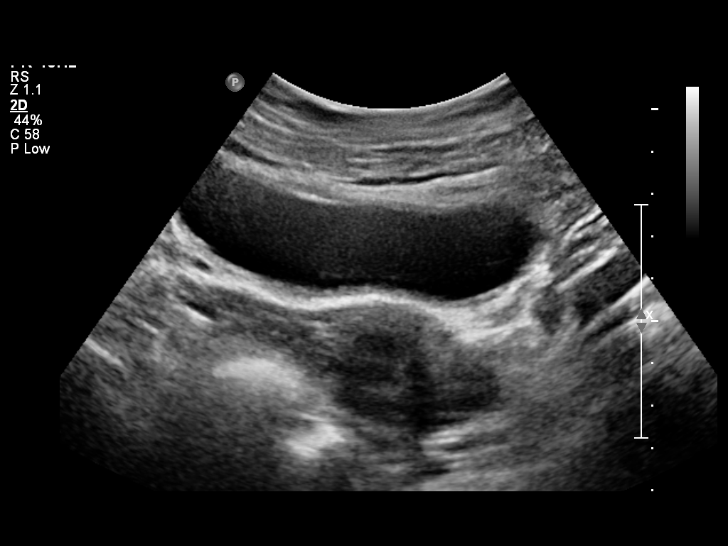
[im 15/27]
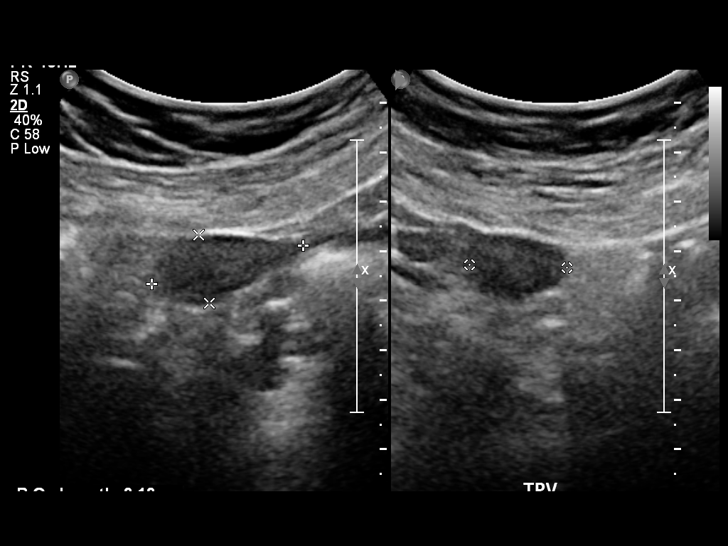
[im 17/27]
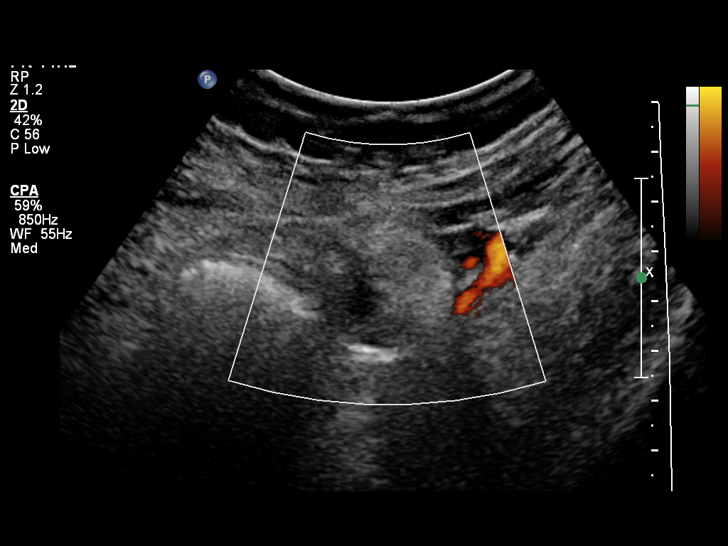
[im 19/27]
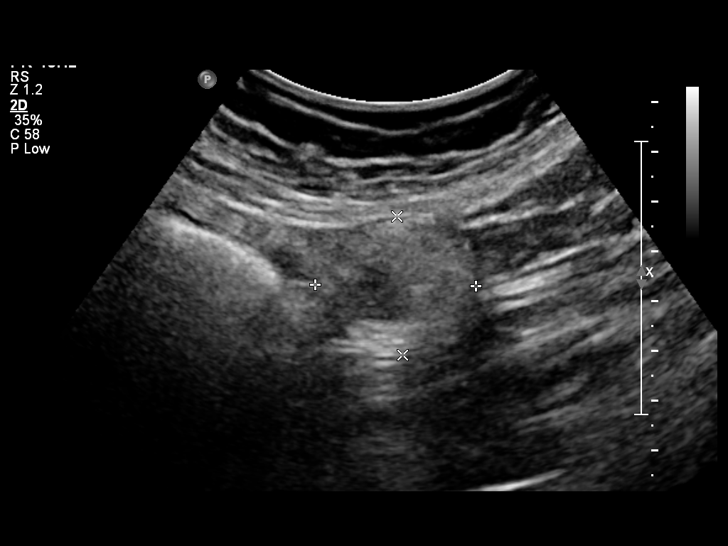
[im 21/27]
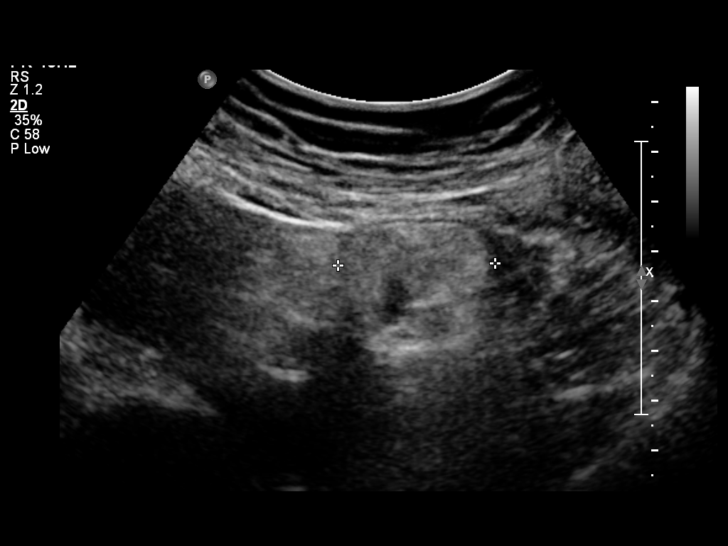
[im 23/27]
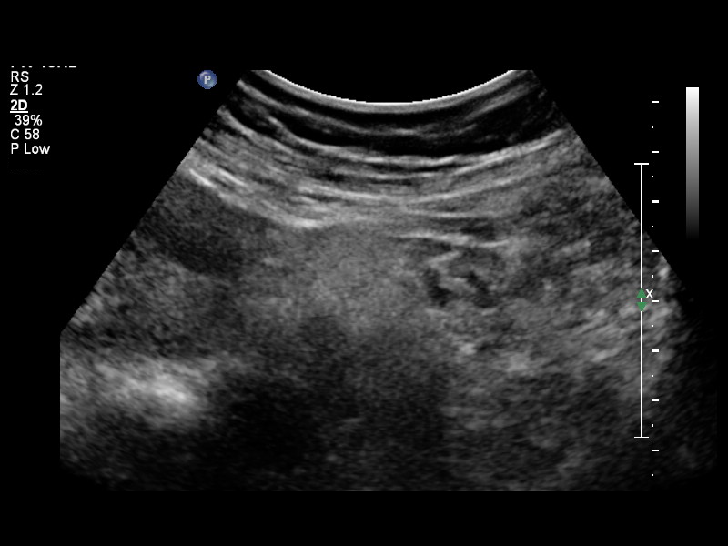
[im 25/27]
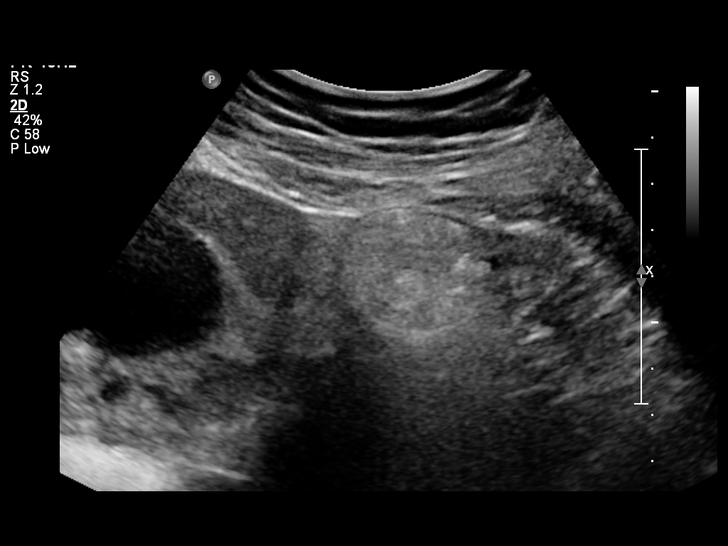
[im 27/27]
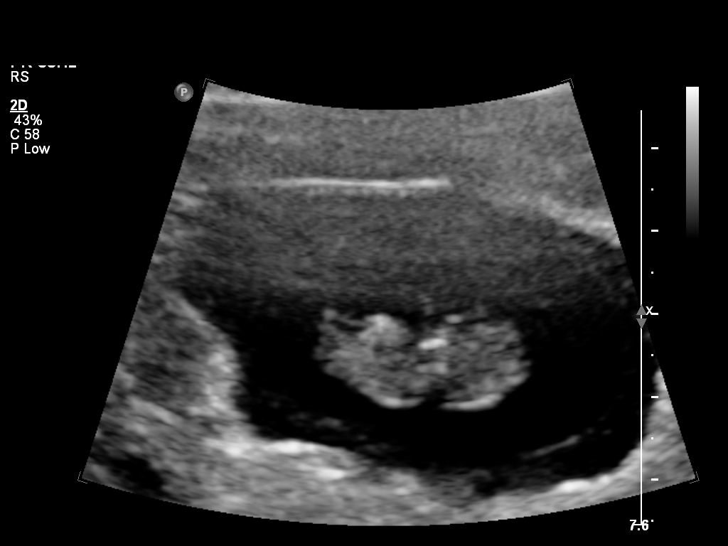

[14 of 27 positions shown; findings below may reference images not displayed]

FINDINGS: Intrauterine gestational sac: None identified

Yolk sac:  None identified

Embryo:  None identified

Maternal uterus/adnexae: Right ovary has a normal appearance. The
left ovary is not seen. The endometrium is thickened, heterogeneous,
and vascular on Doppler evaluation. The endometrium measures
approximately 1.7 cm in thickness.
IMPRESSION: 1. Thickened endometrium with absent gestational sac. Findings are
consistent with retained products of conception.
2. Previously, the left ovary was noted to be echogenic. However,
the left ovary is not well seen today. For this reason, followup is
recommended. Pelvic ultrasound is suggested in 4-6 weeks.

## 2015-08-17 IMAGING — US US OB TRANSVAGINAL
1 series · 14 of 23 positions shown · non-contrast
Comparison: Ultrasound 09/04/2013.

CLINICAL DATA: Heavy vaginal bleeding. Intrauterine fetal demise
noted on 09/15/2013. At that time, no procedure was performed.

EXAM:
TRANSVAGINAL OB ULTRASOUND; OBSTETRIC <14 WK ULTRASOUND
TECHNIQUE: Transvaginal ultrasound was performed for complete evaluation of the
gestation as well as the maternal uterus, adnexal regions, and
pelvic cul-de-sac.

[Series 1: us ob transvaginal · 14 of 23 slices shown]
[im 1/23]
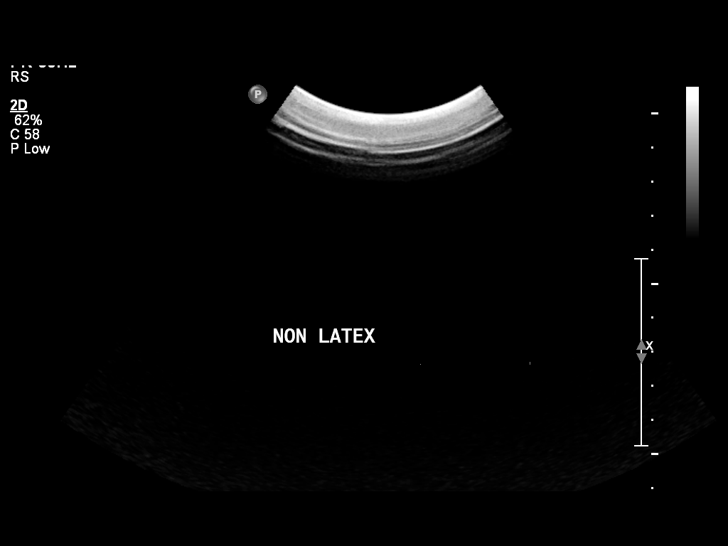
[im 3/23]
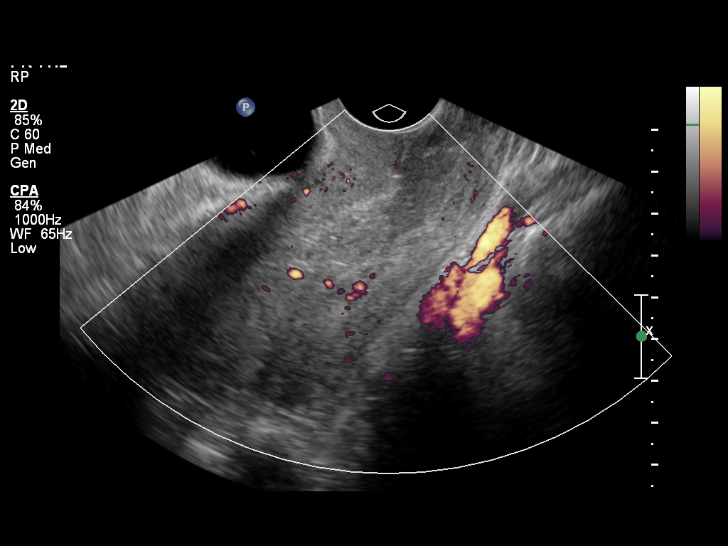
[im 5/23]
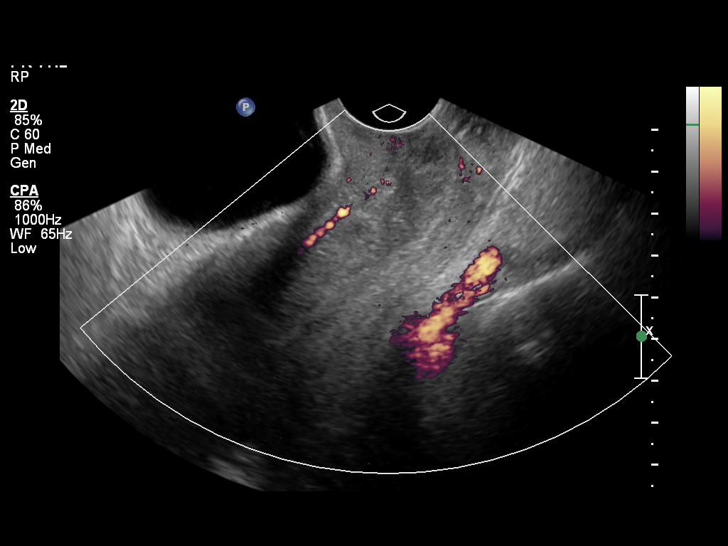
[im 6/23]
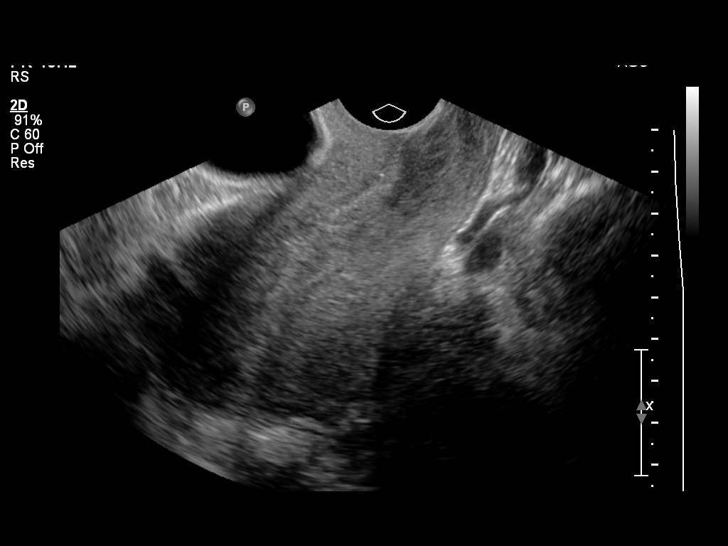
[im 8/23]
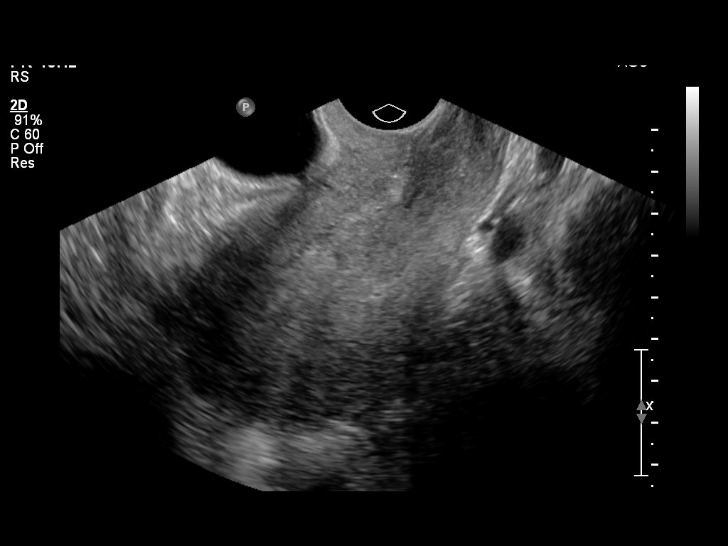
[im 10/23]
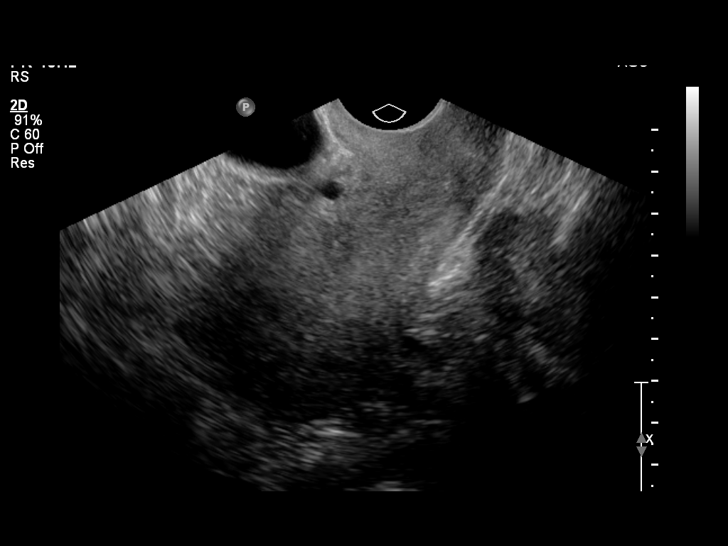
[im 11/23]
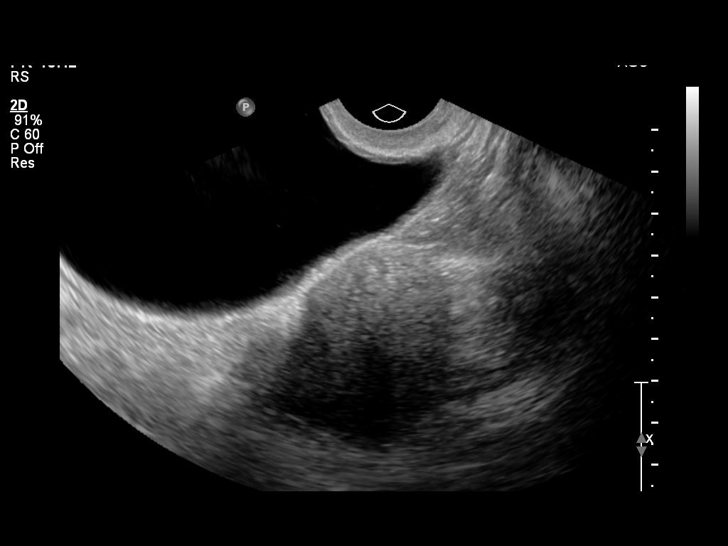
[im 13/23]
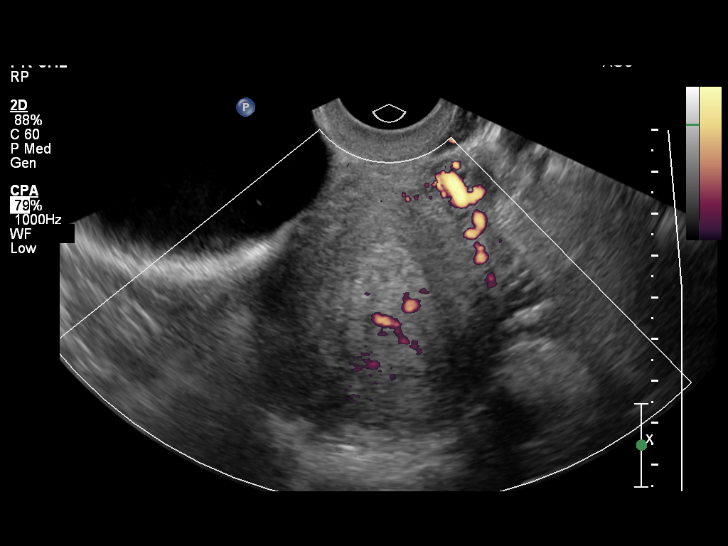
[im 14/23]
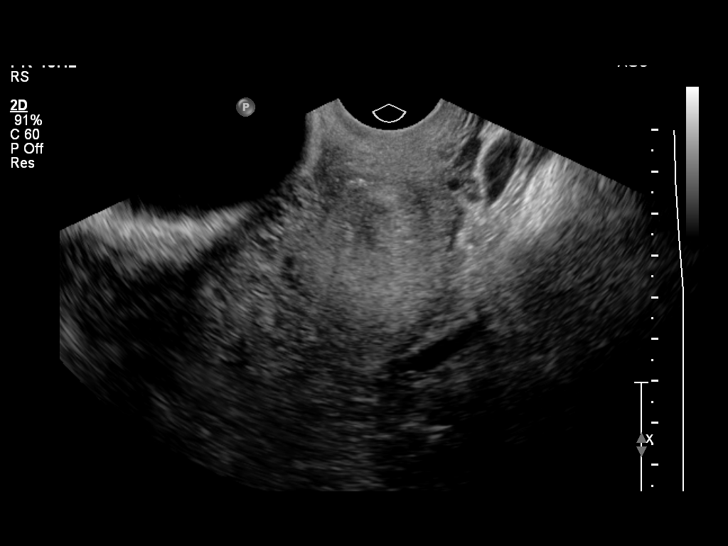
[im 16/23]
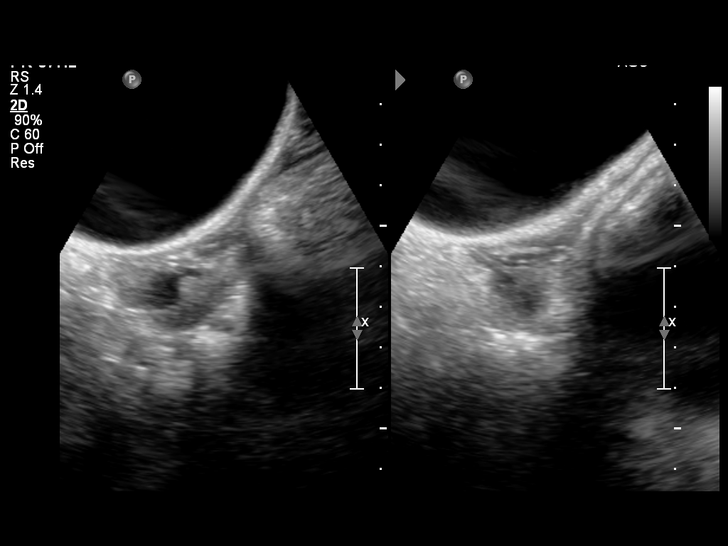
[im 18/23]
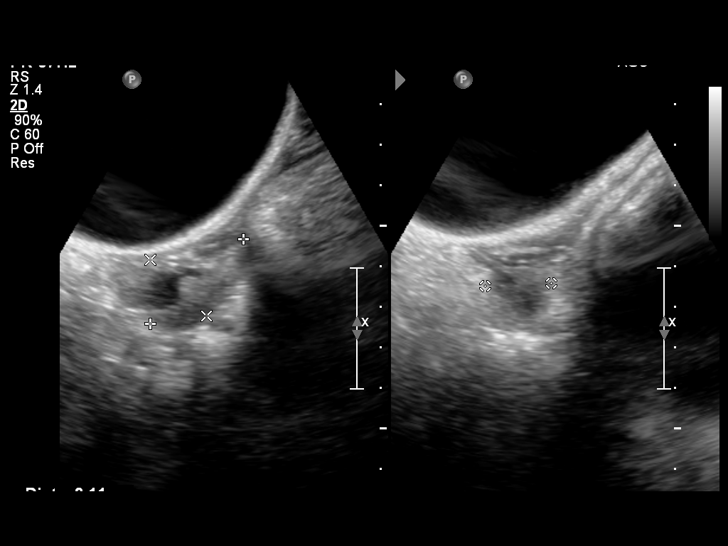
[im 19/23]
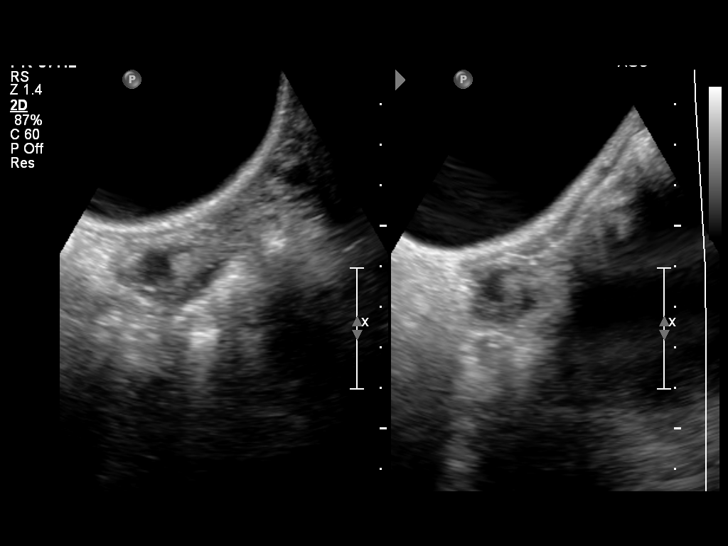
[im 21/23]
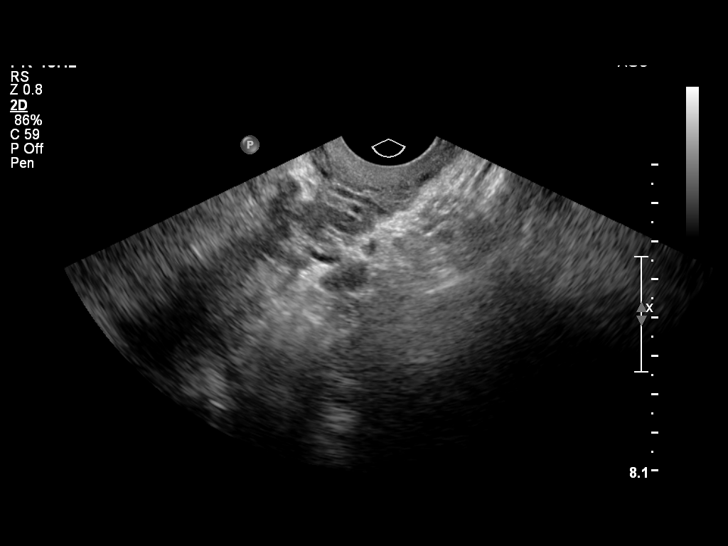
[im 23/23]
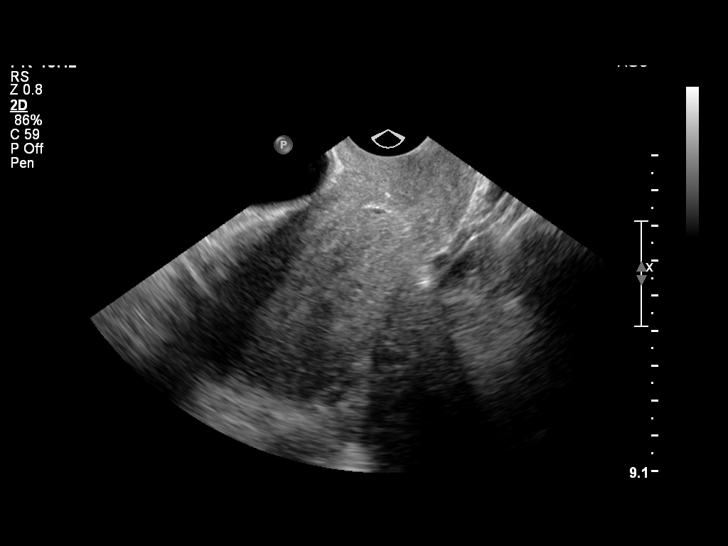

[14 of 23 positions shown; findings below may reference images not displayed]

FINDINGS: Intrauterine gestational sac: None identified

Yolk sac:  None identified

Embryo:  None identified

Maternal uterus/adnexae: Right ovary has a normal appearance. The
left ovary is not seen. The endometrium is thickened, heterogeneous,
and vascular on Doppler evaluation. The endometrium measures
approximately 1.7 cm in thickness.
IMPRESSION: 1. Thickened endometrium with absent gestational sac. Findings are
consistent with retained products of conception.
2. Previously, the left ovary was noted to be echogenic. However,
the left ovary is not well seen today. For this reason, followup is
recommended. Pelvic ultrasound is suggested in 4-6 weeks.

## 2016-04-01 DIAGNOSIS — J069 Acute upper respiratory infection, unspecified: Secondary | ICD-10-CM

## 2016-04-01 NOTE — Congregational Nurse Program (Signed)
Congregational Nurse Program Note  Date of Encounter: 04/01/2016  Past Medical History: Past Medical History  Diagnosis Date  . Hypertension     Encounter Details:     CNP Questionnaire - 04/01/16 1000    Patient Demographics   Is this a new or existing patient? New   Patient is considered a/an Immigrant   Race Latino/Hispanic   Patient Assistance   Location of Patient Assistance [employer] [place]   Patient's financial/insurance status Low Income   Uninsured Patient Yes   Interventions Counseled to make appt. with provider;Assisted patient in making appt.   Patient referred to apply for the following financial assistance Not Applicable   Food insecurities addressed Not Applicable   Transportation assistance No   Assistance securing medications No   Educational health offerings Acute disease;Navigating the healthcare system;Health literacy   Encounter Details   Primary purpose of visit Acute Illness/Condition Visit;Chronic Illness/Condition Visit;Navigating the Healthcare System   Was an Emergency Department visit averted? No   Does patient have a medical provider? No   Patient referred to Clinic   Was a mental health screening completed? (GAINS tool) No   Does patient have dental issues? No   Does patient have vision issues? No   Since previous encounter, have you referred patient for abnormal blood pressure that resulted in a new diagnosis or medication change? No   Since previous encounter, have you referred patient for abnormal blood glucose that resulted in a new diagnosis or medication change? No   For Abstraction Use Only   Does patient have insurance? No     [facility]. Client had previously been a client at the free clinic, but stopped going and is now sick and would like to be screened [facility]. Client reports she has been not feeling well with cough and upper respiratory symptoms for 4  weeks. Last week for only one sick visit she [facility] and was prescribed antibiotic to take for 5 days(questionable azithromax) client does not know the name of medication. She said they told her she had bronchitis but she is not feeling any better. She reports pain when coughing and headaches, cough is non productive. She states she is not taking any cough medication, but then stated some OTC med that she has gotten at the "[employer] [facility]" . Denies fever or sweats. LMP: 02/08/16 and regular. Client lives with her husband and she reports she is breastfeeding her child. NKDA. Will refer the client per her decision [facility]. Appointment secured for 04/02/16 at 0930 am. Will follow up as needed.

## 2016-04-02 ENCOUNTER — Ambulatory Visit (HOSPITAL_COMMUNITY)
Admission: RE | Admit: 2016-04-02 | Discharge: 2016-04-02 | Disposition: A | Discharge status: Home / Self Care | Payer: Self-pay | Source: Ambulatory Visit | Attending: Physician Assistant | Admitting: Physician Assistant

## 2016-04-02 ENCOUNTER — Ambulatory Visit: Payer: Self-pay | Admitting: Physician Assistant

## 2016-04-02 ENCOUNTER — Encounter: Payer: Self-pay | Admitting: Physician Assistant

## 2016-04-02 VITALS — BP 130/94 | HR 89 | Temp 98.2°F | Ht 59.75 in | Wt 138.5 lb

## 2016-04-02 DIAGNOSIS — R062 Wheezing: Secondary | ICD-10-CM | POA: Insufficient documentation

## 2016-04-02 DIAGNOSIS — J209 Acute bronchitis, unspecified: Secondary | ICD-10-CM

## 2016-04-02 MED ORDER — ALBUTEROL SULFATE HFA 108 (90 BASE) MCG/ACT IN AERS
2.0000 | INHALATION_SPRAY | Freq: Four times a day (QID) | RESPIRATORY_TRACT | Status: DC | PRN
Start: 2016-04-02 — End: 2017-01-21

## 2016-04-02 MED ORDER — ALBUTEROL SULFATE (2.5 MG/3ML) 0.083% IN NEBU
2.5000 mg | INHALATION_SOLUTION | Freq: Once | RESPIRATORY_TRACT | Status: AC
  Administered 2016-04-02: 2.5 mg via RESPIRATORY_TRACT

## 2016-04-02 MED ORDER — SULFAMETHOXAZOLE-TRIMETHOPRIM 800-160 MG PO TABS: 1.0000 | ORAL_TABLET | Freq: Two times a day (BID) | ORAL | Status: DC

## 2016-04-02 NOTE — Progress Notes (Signed)
BP 130/94 mmHg  Pulse 89  Temp(Src) 98.2 F (36.8 C)  Ht 4' 11.75" (1.518 m)  Wt 138 lb 8 oz (62.823 kg)  BMI 27.26 kg/m2  SpO2 93%   Subjective:    Patient ID: Desiree Thornton, female    DOB: 1981/04/01, 35 y.o.   MRN: [id number]  HPI: Desiree Thornton is a 35 y.o. female presenting on 04/02/2016 for New Patient (Initial Visit) and Nasal Congestion   HPI   Chief Complaint  Patient presents with  . New Patient (Initial Visit)    returning patient. after stopping coming here [facility], but then went to [facility].   . Nasal Congestion    with cough, sneezing, for 4 weeks.     Abn cxr 04/14/13   Pt was seen here- last OV was June 2015.  She was dismissed from practice due to multiple no-shows.  Pt states she is not going back [facility]  Pt says she is still breastfeeding her 5014 month old  Relevant past medical, surgical, family and social history reviewed and updated as indicated. Interim medical history since our last visit reviewed. Allergies and medications reviewed and updated.  Review of Systems  Constitutional: Negative for fever.  HENT: Positive for sneezing.   Respiratory: Positive for cough, shortness of breath and wheezing.     Per HPI unless specifically indicated above     Objective:    BP 130/94 mmHg  Pulse 89  Temp(Src) 98.2 F (36.8 C)  Ht 4' 11.75" (1.518 m)  Wt 138 lb 8 oz (62.823 kg)  BMI 27.26 kg/m2  SpO2 93%  Wt Readings from Last 3 Encounters:  04/02/16 138 lb 8 oz (62.823 kg)  09/15/13 139 lb 9.6 oz (63.322 kg)  09/04/13 138 lb (62.596 kg)    Physical Exam  Constitutional: She is oriented to person, place, and time. She appears well-developed and well-nourished.  HENT:  Head: Normocephalic and atraumatic.  Right Ear: Hearing, tympanic membrane, external ear and ear canal normal.  Left Ear: Hearing, tympanic membrane, external ear and ear canal normal.  Nose: Nose normal.   Mouth/Throat: Uvula is midline and oropharynx is clear and moist. No oropharyngeal exudate.  Eyes: Conjunctivae and EOM are normal. Pupils are equal, round, and reactive to light.  Neck: Neck supple. No thyromegaly present.  Cardiovascular: Normal rate and regular rhythm.   Pulmonary/Chest: Effort normal. She has wheezes. She has no rales.  Abdominal: Soft. Bowel sounds are normal. She exhibits no mass. There is no hepatosplenomegaly. There is no tenderness.  Musculoskeletal: She exhibits no edema.  Lymphadenopathy:    She has no cervical adenopathy.  Neurological: She is alert and oriented to person, place, and time. Gait normal.  Skin: Skin is warm and dry.  Psychiatric: She has a normal mood and affect. Her behavior is normal.  Vitals reviewed.  Wheezes significantly decreased after nebulizer treatment     Assessment & Plan:    Encounter Diagnoses  Name Primary?  . Wheezing Yes  . Acute bronchitis, unspecified organism     -pt is counseld to get on birth control to prevent another pregnancy since she does not want another child -Counseled that it is time she should stop breastfeeding a 6614 month old -rx septra and albuterol inhaler (rx influenced due to pt still breastfeeding and unlikely to stop due to discussion of discontinuing this) -pt to get cxr-  She is given [employer] discount application to help cover this cost -  F/u 1 month. RTO sooner prn worsening

## 2016-05-05 ENCOUNTER — Ambulatory Visit: Payer: Self-pay | Admitting: Physician Assistant

## 2016-05-06 ENCOUNTER — Ambulatory Visit: Payer: Self-pay | Admitting: Physician Assistant

## 2016-05-06 ENCOUNTER — Encounter: Payer: Self-pay | Admitting: Physician Assistant

## 2016-05-06 VITALS — BP 118/86 | HR 80 | Temp 98.1°F | Ht 59.75 in | Wt 141.8 lb

## 2016-05-06 DIAGNOSIS — R05 Cough: Secondary | ICD-10-CM

## 2016-05-06 DIAGNOSIS — R059 Cough, unspecified: Secondary | ICD-10-CM

## 2016-05-06 NOTE — Progress Notes (Signed)
   BP 118/86 mmHg  Pulse 80  Temp(Src) 98.1 F (36.7 C)  Ht 4' 11.75" (1.518 m)  Wt 141 lb 12.8 oz (64.32 kg)  BMI 27.91 kg/m2  SpO2 99%   Subjective:    Patient ID: Desiree Thornton, female    DOB: 1981/05/04, 35 y.o.   MRN: [id number]  HPI: Desiree Thornton is a 35 y.o. female presenting on 05/06/2016 for Cough   HPI   Chief Complaint  Patient presents with  . Cough    has very little couhg now. pt uses inhaler when she needs it.   Pt says she is feeling well now  Relevant past medical, surgical, family and social history reviewed and updated as indicated. Interim medical history since our last visit reviewed. Allergies and medications reviewed and updated.   Current outpatient prescriptions:  .  albuterol (PROVENTIL HFA;VENTOLIN HFA) 108 (90 Base) MCG/ACT inhaler, Inhale 2 puffs into the lungs every 6 (six) hours as needed for wheezing or shortness of breath. inhale 2 veces en los pulmones cada 6 horas cuando necesite para la sibilancias o falta de aire., Disp: 1 Inhaler, Rfl: 0   Review of Systems  Constitutional: Negative for fever, chills, diaphoresis, appetite change, fatigue and unexpected weight change.  HENT: Negative for congestion, dental problem, drooling, ear pain, facial swelling, hearing loss, mouth sores, sneezing, sore throat, trouble swallowing and voice change.   Eyes: Negative for pain, discharge, redness, itching and visual disturbance.  Respiratory: Negative for cough, choking, shortness of breath and wheezing.   Cardiovascular: Negative for chest pain, palpitations and leg swelling.  Gastrointestinal: Negative for vomiting, abdominal pain, diarrhea, constipation and blood in stool.  Endocrine: Negative for cold intolerance, heat intolerance and polydipsia.  Genitourinary: Negative for dysuria, hematuria and decreased urine volume.  Musculoskeletal: Negative for back pain, arthralgias and gait problem.  Skin: Negative for rash.   Allergic/Immunologic: Negative for environmental allergies.  Neurological: Negative for seizures, syncope, light-headedness and headaches.  Hematological: Negative for adenopathy.  Psychiatric/Behavioral: Negative for suicidal ideas, dysphoric mood and agitation. The patient is not nervous/anxious.     Per HPI unless specifically indicated above     Objective:    BP 118/86 mmHg  Pulse 80  Temp(Src) 98.1 F (36.7 C)  Ht 4' 11.75" (1.518 m)  Wt 141 lb 12.8 oz (64.32 kg)  BMI 27.91 kg/m2  SpO2 99%  Wt Readings from Last 3 Encounters:  05/06/16 141 lb 12.8 oz (64.32 kg)  04/02/16 138 lb 8 oz (62.823 kg)  09/15/13 139 lb 9.6 oz (63.322 kg)    Physical Exam  Constitutional: She is oriented to person, place, and time. She appears well-developed and well-nourished.  HENT:  Head: Normocephalic and atraumatic.  Neck: Neck supple.  Cardiovascular: Normal rate and regular rhythm.   Pulmonary/Chest: Effort normal and breath sounds normal.  Abdominal: Soft. Bowel sounds are normal. She exhibits no mass. There is no hepatosplenomegaly. There is no tenderness.  Musculoskeletal: She exhibits no edema.  Lymphadenopathy:    She has no cervical adenopathy.  Neurological: She is alert and oriented to person, place, and time.  Skin: Skin is warm and dry.  Psychiatric: She has a normal mood and affect. Her behavior is normal.  Vitals reviewed.       Assessment & Plan:   Encounter Diagnosis  Name Primary?  . Cough Yes    -reviewed cxr results with pt -F/u 1 year.  RTO sooner prn

## 2016-06-04 ENCOUNTER — Encounter: Payer: Self-pay | Admitting: Physician Assistant

## 2016-07-19 LAB — BASIC METABOLIC PANEL: Glucose: 86 mg/dL

## 2017-01-21 ENCOUNTER — Encounter: Payer: Self-pay | Admitting: Physician Assistant

## 2017-01-21 ENCOUNTER — Ambulatory Visit: Payer: Self-pay | Admitting: Physician Assistant

## 2017-01-21 VITALS — BP 128/80 | HR 88 | Temp 98.1°F | Ht 59.75 in | Wt 146.0 lb

## 2017-01-21 DIAGNOSIS — R42 Dizziness and giddiness: Secondary | ICD-10-CM

## 2017-01-21 LAB — BASIC METABOLIC PANEL
BUN: 11 mg/dL (ref 7–25)
CHLORIDE: 104 mmol/L (ref 98–110)
CO2: 23 mmol/L (ref 20–31)
Calcium: 9.1 mg/dL (ref 8.6–10.2)
Creat: 0.63 mg/dL (ref 0.50–1.10)
Glucose, Bld: 93 mg/dL (ref 65–99)
POTASSIUM: 4.2 mmol/L (ref 3.5–5.3)
Sodium: 138 mmol/L (ref 135–146)

## 2017-01-21 LAB — CBC
HEMATOCRIT: 37.4 % (ref 35.0–45.0)
Hemoglobin: 12.4 g/dL (ref 11.7–15.5)
MCH: 29.2 pg (ref 27.0–33.0)
MCHC: 33.2 g/dL (ref 32.0–36.0)
MCV: 88.2 fL (ref 80.0–100.0)
MPV: 9.2 fL (ref 7.5–12.5)
Platelets: 330 10*3/uL (ref 140–400)
RBC: 4.24 MIL/uL (ref 3.80–5.10)
RDW: 14.4 % (ref 11.0–15.0)
WBC: 4.6 10*3/uL (ref 3.8–10.8)

## 2017-01-21 NOTE — Patient Instructions (Signed)
Mareos (Dizziness) Los mareos son un problema muy frecuente. Se trata de una sensacin de inestabilidad o de desvanecimiento. Puede sentir que se va a desmayar. Un mareo puede provocarle una lesin si se tropieza o se cae. Las personas de todas las edades pueden sufrir mareos, pero es ms frecuente en los adultos mayores. Esta afeccin puede tener muchas causas, entre las que se pueden mencionar los medicamentos, la deshidratacin y las enfermedades. INSTRUCCIONES PARA EL CUIDADO EN EL HOGAR Estas indicaciones pueden ayudarlo con el trastorno: Comida y bebida  Beba suficiente lquido para mantener la orina clara o de color amarillo plido. Esto evita la deshidratacin. Trate de beber ms lquidos transparentes, como agua.  No beba alcohol.  Limite el consumo de cafena si el mdico se lo indica.  Limite el consumo de sal si el mdico se lo indica. Actividad  Evite los movimientos rpidos. ? Levntese de las sillas con lentitud y apyese hasta sentirse bien. ? Por la maana, sintese primero a un lado de la cama. Cuando se sienta bien, pngase lentamente de pie mientras se sostiene de algo, hasta que sepa que ha logrado el equilibrio.  Mueva las piernas con frecuencia si debe estar de pie en un lugar durante mucho tiempo. Mientras est de pie, contraiga y relaje los msculos de las piernas.  No conduzca vehculos ni opere maquinaria pesada si se siente mareado.  Evite agacharse si se siente mareado. En su casa, coloque los objetos de modo que le resulte fcil alcanzarlos sin agacharse. Estilo de vida  No consuma ningn producto que contenga tabaco, lo que incluye cigarrillos, tabaco de mascar o cigarrillos electrnicos. Si necesita ayuda para dejar de fumar, consulte al mdico.  Trate de reducir el nivel de estrs practicando actividades como el yoga o la meditacin. Hable con el mdico si necesita ayuda. Instrucciones generales  Controle sus mareos para ver si hay cambios.  Tome los  medicamentos solamente como se lo haya indicado el mdico. Hable con el mdico si cree que los medicamentos que est tomando son la causa de sus mareos.  Infrmele a un amigo o a un familiar si se siente mareado. Pdale a esta persona que llame al mdico si observa cambios en su comportamiento.  Concurra a todas las visitas de control como se lo haya indicado el mdico. Esto es importante. SOLICITE ATENCIN MDICA SI:  Los mareos persisten.  Los mareos o la sensacin de desvanecimiento empeoran.  Siente nuseas.  Ha perdido la audicin.  Aparecen nuevos sntomas.  Cuando est de pie se siente inestable o que la habitacin da vueltas.  SOLICITE ATENCIN MDICA DE INMEDIATO SI:  Vomita o tiene diarrea y no puede comer ni beber nada.  Tiene dificultad para hablar, para caminar, para tragar o para usar los brazos, las manos o las piernas.  Siente una debilidad generalizada.  No piensa con claridad o tiene dificultades para armar oraciones. Es posible que un amigo o un familiar adviertan que esto ocurre.  Tiene dolor de pecho, dolor abdominal, sudoracin o le falta el aire.  Hay cambios en la visin.  Observa un sangrado.  Tiene dolores de cabeza.  Tiene dolor o rigidez en el cuello.  Tiene fiebre.  Esta informacin no tiene como fin reemplazar el consejo del mdico. Asegrese de hacerle al mdico cualquier pregunta que tenga. Document Released: 11/17/2005 Document Revised: 04/03/2015 Document Reviewed: 11/13/2014 Elsevier Interactive Patient Education  2017 Elsevier Inc.  

## 2017-01-21 NOTE — Progress Notes (Signed)
BP 128/80 (BP Location: Left Arm, Patient Position: Sitting, Cuff Size: Normal)   Pulse 88   Temp 98.1 F (36.7 C)   Ht 4' 11.75" (1.518 m)   Wt 146 lb (66.2 kg)   SpO2 99%   BMI 28.75 kg/m    Subjective:    Patient ID: Desiree Thornton, female    DOB: 1981/09/19, 36 y.o.   MRN: [id number]  HPI: Desiree Thornton is a 37 y.o. female presenting on 01/21/2017 for Dizziness (started 2 weeks ago. pt states has happened 3 days out of a week and it occurs 2 times a day. pt states she is standing or sometimes walking when she starts feeling dizzy.)   HPI  Chief Complaint  Patient presents with  . Dizziness    started 2 weeks ago. pt states has happened 3 days out of a week and it occurs 2 times a day. pt states she is standing or sometimes walking when she starts feeling dizzy.    She says it lasts about a minute when it comes.  No nausea.  She feels a bit headache and then the dizziness for about a minute.  The HA stays for an hour or two.   No cough or congestion.  No changes in vision.   Relevant past medical, surgical, family and social history reviewed and updated as indicated. Interim medical history since our last visit reviewed. Allergies and medications reviewed and updated.   Current Outpatient Prescriptions:  .  Prenatal Vit-Fe Fumarate-FA (M-VIT PO), Take by mouth., Disp: , Rfl:    Review of Systems  Constitutional: Negative for appetite change, chills, diaphoresis, fatigue, fever and unexpected weight change.  HENT: Negative for congestion, dental problem, drooling, ear pain, facial swelling, hearing loss, mouth sores, sneezing, sore throat, trouble swallowing and voice change.   Eyes: Negative for pain, discharge, redness, itching and visual disturbance.  Respiratory: Negative for cough, choking, shortness of breath and wheezing.   Cardiovascular: Negative for chest pain, palpitations and leg swelling.  Gastrointestinal: Negative for abdominal  pain, blood in stool, constipation, diarrhea and vomiting.  Endocrine: Negative for cold intolerance, heat intolerance and polydipsia.  Genitourinary: Negative for decreased urine volume, dysuria and hematuria.  Musculoskeletal: Negative for arthralgias, back pain and gait problem.  Skin: Negative for rash.  Allergic/Immunologic: Negative for environmental allergies.  Neurological: Positive for light-headedness and headaches. Negative for seizures and syncope.  Hematological: Negative for adenopathy.  Psychiatric/Behavioral: Negative for agitation, dysphoric mood and suicidal ideas. The patient is not nervous/anxious.     Per HPI unless specifically indicated above     Objective:    BP 128/80 (BP Location: Left Arm, Patient Position: Sitting, Cuff Size: Normal)   Pulse 88   Temp 98.1 F (36.7 C)   Ht 4' 11.75" (1.518 m)   Wt 146 lb (66.2 kg)   SpO2 99%   BMI 28.75 kg/m   Wt Readings from Last 3 Encounters:  01/21/17 146 lb (66.2 kg)  05/06/16 141 lb 12.8 oz (64.3 kg)  04/02/16 138 lb 8 oz (62.8 kg)    Physical Exam  Constitutional: She is oriented to person, place, and time. She appears well-developed and well-nourished.  HENT:  Head: Normocephalic and atraumatic.  Right Ear: Hearing, tympanic membrane, external ear and ear canal normal.  Left Ear: Hearing, tympanic membrane, external ear and ear canal normal.  Nose: Nose normal.  Mouth/Throat: Uvula is midline and oropharynx is clear and moist. No oropharyngeal exudate.  Neck: Neck supple.  Cardiovascular:  Normal rate and regular rhythm.   Pulmonary/Chest: Effort normal and breath sounds normal. She has no wheezes.  Abdominal: Soft. Bowel sounds are normal. She exhibits no mass. There is no hepatosplenomegaly. There is no tenderness.  Musculoskeletal: She exhibits no edema.  Lymphadenopathy:    She has no cervical adenopathy.  Neurological: She is alert and oriented to person, place, and time. She has normal strength.  She displays no atrophy and no tremor. No cranial nerve deficit or sensory deficit. She exhibits normal muscle tone. She displays a negative Romberg sign. Coordination and gait normal.  Reflex Scores:      Patellar reflexes are 2+ on the right side and 2+ on the left side. Skin: Skin is warm and dry.  Psychiatric: She has a normal mood and affect. Her behavior is normal.  Vitals reviewed.   Results for orders placed or performed in visit on 07/19/16  Basic metabolic panel  Result Value Ref Range   Glucose 86 mg/dL      Assessment & Plan:    Encounter Diagnosis  Name Primary?  . Dizziness Yes    Will check labs.  Reassured pt that exam normal.  Will call with lab results.  Follow up as scheduled.  RTO for new symptoms or worsening.

## 2017-03-03 ENCOUNTER — Encounter: Payer: Self-pay | Admitting: Physician Assistant

## 2017-03-31 ENCOUNTER — Ambulatory Visit: Payer: Self-pay | Admitting: Physician Assistant

## 2017-03-31 ENCOUNTER — Encounter: Payer: Self-pay | Admitting: Physician Assistant

## 2017-03-31 VITALS — BP 120/78 | HR 87 | Temp 98.1°F | Wt 144.5 lb

## 2017-03-31 DIAGNOSIS — J302 Other seasonal allergic rhinitis: Secondary | ICD-10-CM

## 2017-03-31 NOTE — Patient Instructions (Addendum)
Can use over the counter steroid nasal spray like flonase, nasacort or rhinocort.  Puede usar un espray nasal esteroide de la farmacia sin receta como flonase, nasacort, o rhinocort.  Rinitis alrgica (Allergic Rhinitis) La rinitis alrgica ocurre cuando las membranas mucosas de la nariz responden a los alrgenos. Los alrgenos son las partculas que estn en el aire y que hacen que el cuerpo tenga una reaccin Counselling psychologist. Esto hace que usted libere anticuerpos alrgicos. A travs de una cadena de eventos, estos finalmente hacen que usted libere histamina en la corriente sangunea. Aunque la funcin de la histamina es proteger al organismo, es esta liberacin de histamina lo que provoca malestar, como los estornudos frecuentes, la congestin y goteo y Control and instrumentation engineer. CAUSAS La causa de la rinitis Merchandiser, retail (fiebre del heno) son los alrgenos del polen que pueden provenir del csped, los rboles y Theme park manager. La causa de la rinitis IT consultant (rinitis alrgica perenne) son los alrgenos, como los caros del polvo domstico, la caspa de las mascotas y las esporas del moho. SNTOMAS  Secrecin nasal (congestin).  Goteo y picazn nasales con estornudos y Arboriculturist. DIAGNSTICO Su mdico puede ayudarlo a Warehouse manager alrgeno o los alrgenos que desencadenan sus sntomas. Si usted y su mdico no pueden Chief Strategy Officer cul es el alrgeno, pueden hacerse anlisis de sangre o estudios de la piel. El mdico diagnosticar la afeccin despus de hacerle una historia clnica y un examen fsico. Adems, puede evaluarlo para detectar la presencia de otras enfermedades afines, como asma, conjuntivitis u otitis. TRATAMIENTO La rinitis alrgica no tiene [place], pero puede controlarse con lo siguiente:  Medicamentos que [employer] sntomas de [place], por ejemplo, vacunas contra la [place], aerosoles nasales y antihistamnicos por va oral.  Evitar el alrgeno. La fiebre del heno a menudo puede  tratarse con antihistamnicos en las formas de pldoras o aerosol nasal. Los antihistamnicos bloquean los efectos de la histamina. Existen medicamentos de venta libre que pueden ayudar con la congestin nasal y la hinchazn alrededor de los ojos. Consulte a su mdico antes de tomar o administrarse este medicamento. Si la prevencin del alrgeno o el medicamento recetado no dan resultado, existen muchos medicamentos nuevos que su mdico puede recetarle. Pueden usarse medicamentos ms fuertes si las medidas iniciales no son efectivas. Pueden aplicarse inyecciones desensibilizantes si los medicamentos y la prevencin no funcionan. La desensibilizacin ocurre cuando un paciente recibe vacunas constantes hasta que el cuerpo se vuelve menos sensible al alrgeno. Asegrese de Medical sales representative seguimiento con su mdico si los problemas continan. INSTRUCCIONES PARA EL CUIDADO EN EL HOGAR No es posible evitar por completo los alrgenos, pero puede reducir los sntomas al tomar medidas para limitar su exposicin a ellos. Es muy til saber exactamente a qu es alrgico para que pueda evitar sus desencadenantes especficos. SOLICITE ATENCIN MDICA SI:  Lance Muss.  Desarrolla una tos que no cesa fcilmente (persistente).  Le falta el aire.  Comienza a tener sibilancias.  Los sntomas interfieren con las actividades diarias normales. Esta informacin no tiene Theme park manager el consejo del mdico. Asegrese de hacerle al mdico cualquier pregunta que tenga. Document Released: 08/27/2005 Document Revised: 12/08/2014 Document Reviewed: 07/25/2013 Elsevier Interactive Patient Education  2017 [employer].

## 2017-03-31 NOTE — Progress Notes (Signed)
BP 120/78 (BP Location: Left Arm, Patient Position: Sitting, Cuff Size: Normal)   Pulse 87   Temp 98.1 F (36.7 C)   Wt 144 lb 8 oz (65.5 kg)   SpO2 97%   BMI 28.46 kg/m    Subjective:    Patient ID: Desiree Thornton, female    DOB: 18-Dec-1980, 36 y.o.   MRN: [id number]  HPI: [name] is a 36 y.o. female presenting on 03/31/2017 for Allergies (itchy eyes, sneezing, nasal congestion, runny nose, chest congestion, cough. pt has taken zyrtec, allegra, and OTC [employer] allergy med. pt states not helpful)   HPI   Chief Complaint  Patient presents with  . Allergies    itchy eyes, sneezing, nasal congestion, runny nose, chest congestion, cough. pt has taken zyrtec, allegra, and OTC [employer] allergy med. pt states not helpful   Her allergies started about 2 wk ago.  She tried Careers adviser, Quarry manager and a [employer] brand.  She took none of those for more than once.     Pt is not pregnant   Relevant past medical, surgical, family and social history reviewed and updated as indicated. Interim medical history since our last visit reviewed. Allergies and medications reviewed and updated.   Current Outpatient Prescriptions:  .  albuterol (VENTOLIN HFA) 108 (90 Base) MCG/ACT inhaler, Inhale into the lungs every 6 (six) hours as needed for wheezing or shortness of breath., Disp: , Rfl:  .  Prenatal Vit-Fe Fumarate-FA (M-VIT PO), Take by mouth., Disp: , Rfl:    Review of Systems  Constitutional: Negative for appetite change, chills, diaphoresis, fatigue, fever and unexpected weight change.  HENT: Positive for dental problem, sneezing and sore throat. Negative for congestion, drooling, ear pain, facial swelling, hearing loss, mouth sores, trouble swallowing and voice change.   Eyes: Positive for itching. Negative for pain, discharge, redness and visual disturbance.  Respiratory: Positive for cough. Negative for choking, shortness of breath and wheezing.   Cardiovascular:  Negative for chest pain, palpitations and leg swelling.  Gastrointestinal: Negative for abdominal pain, blood in stool, constipation, diarrhea and vomiting.  Endocrine: Negative for cold intolerance, heat intolerance and polydipsia.  Genitourinary: Negative for decreased urine volume, dysuria and hematuria.  Musculoskeletal: Negative for arthralgias, back pain and gait problem.  Skin: Negative for rash.  Allergic/Immunologic: Negative for environmental allergies.  Neurological: Negative for seizures, syncope, light-headedness and headaches.  Hematological: Negative for adenopathy.  Psychiatric/Behavioral: Negative for agitation, dysphoric mood and suicidal ideas. The patient is not nervous/anxious.     Per HPI unless specifically indicated above     Objective:    BP 120/78 (BP Location: Left Arm, Patient Position: Sitting, Cuff Size: Normal)   Pulse 87   Temp 98.1 F (36.7 C)   Wt 144 lb 8 oz (65.5 kg)   SpO2 97%   BMI 28.46 kg/m   Wt Readings from Last 3 Encounters:  03/31/17 144 lb 8 oz (65.5 kg)  01/21/17 146 lb (66.2 kg)  05/06/16 141 lb 12.8 oz (64.3 kg)    Physical Exam  Constitutional: She is oriented to person, place, and time. She appears well-developed and well-nourished.  HENT:  Head: Normocephalic and atraumatic.  Right Ear: Hearing, tympanic membrane, external ear and ear canal normal.  Left Ear: Hearing, tympanic membrane, external ear and ear canal normal.  Nose: Rhinorrhea present.  Mouth/Throat: Uvula is midline and oropharynx is clear and moist. No oropharyngeal exudate.  Neck: Neck supple.  Cardiovascular: Normal rate and regular rhythm.   Pulmonary/Chest: Effort  normal and breath sounds normal. She has no wheezes.  Lymphadenopathy:    She has no cervical adenopathy.  Neurological: She is alert and oriented to person, place, and time.  Skin: Skin is warm and dry.  Psychiatric: She has a normal mood and affect. Her behavior is normal.  Vitals  reviewed.       Assessment & Plan:   Encounter Diagnosis  Name Primary?  . Seasonal allergic rhinitis, unspecified trigger Yes    Continue allegra add chlorpheniramine  and flonase Follow up as sheduled.  RTO sooner prn

## 2017-04-20 ENCOUNTER — Ambulatory Visit: Payer: Self-pay | Admitting: Physician Assistant

## 2017-05-18 ENCOUNTER — Encounter: Payer: Self-pay | Admitting: Physician Assistant

## 2018-01-19 ENCOUNTER — Encounter: Payer: Self-pay | Admitting: Physician Assistant

## 2018-02-25 IMAGING — DX DG CHEST 2V
2 series · 2 of 2 positions shown · non-contrast
Comparison: 04/14/2013 chest radiograph.

CLINICAL DATA: Persistent cough and wheezing.

EXAM:
CHEST  2 VIEW

[chest pa]
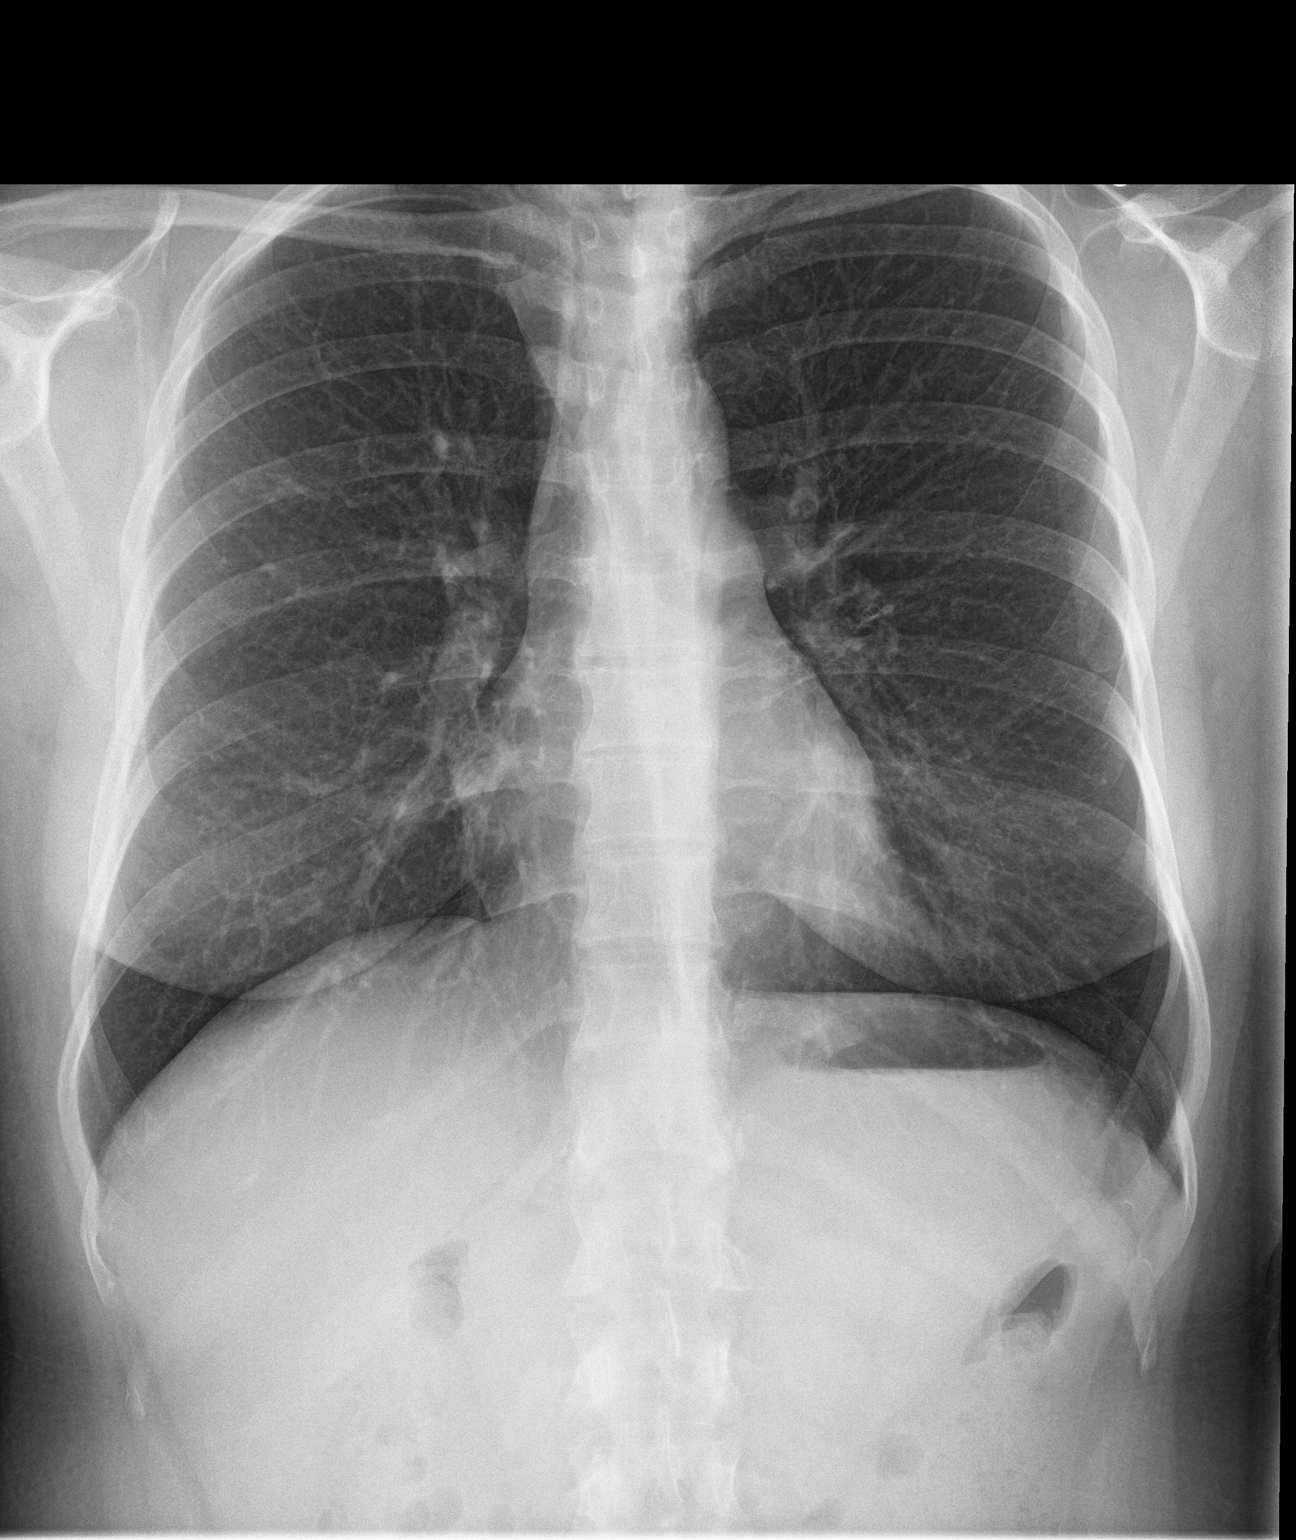

[chest lat]
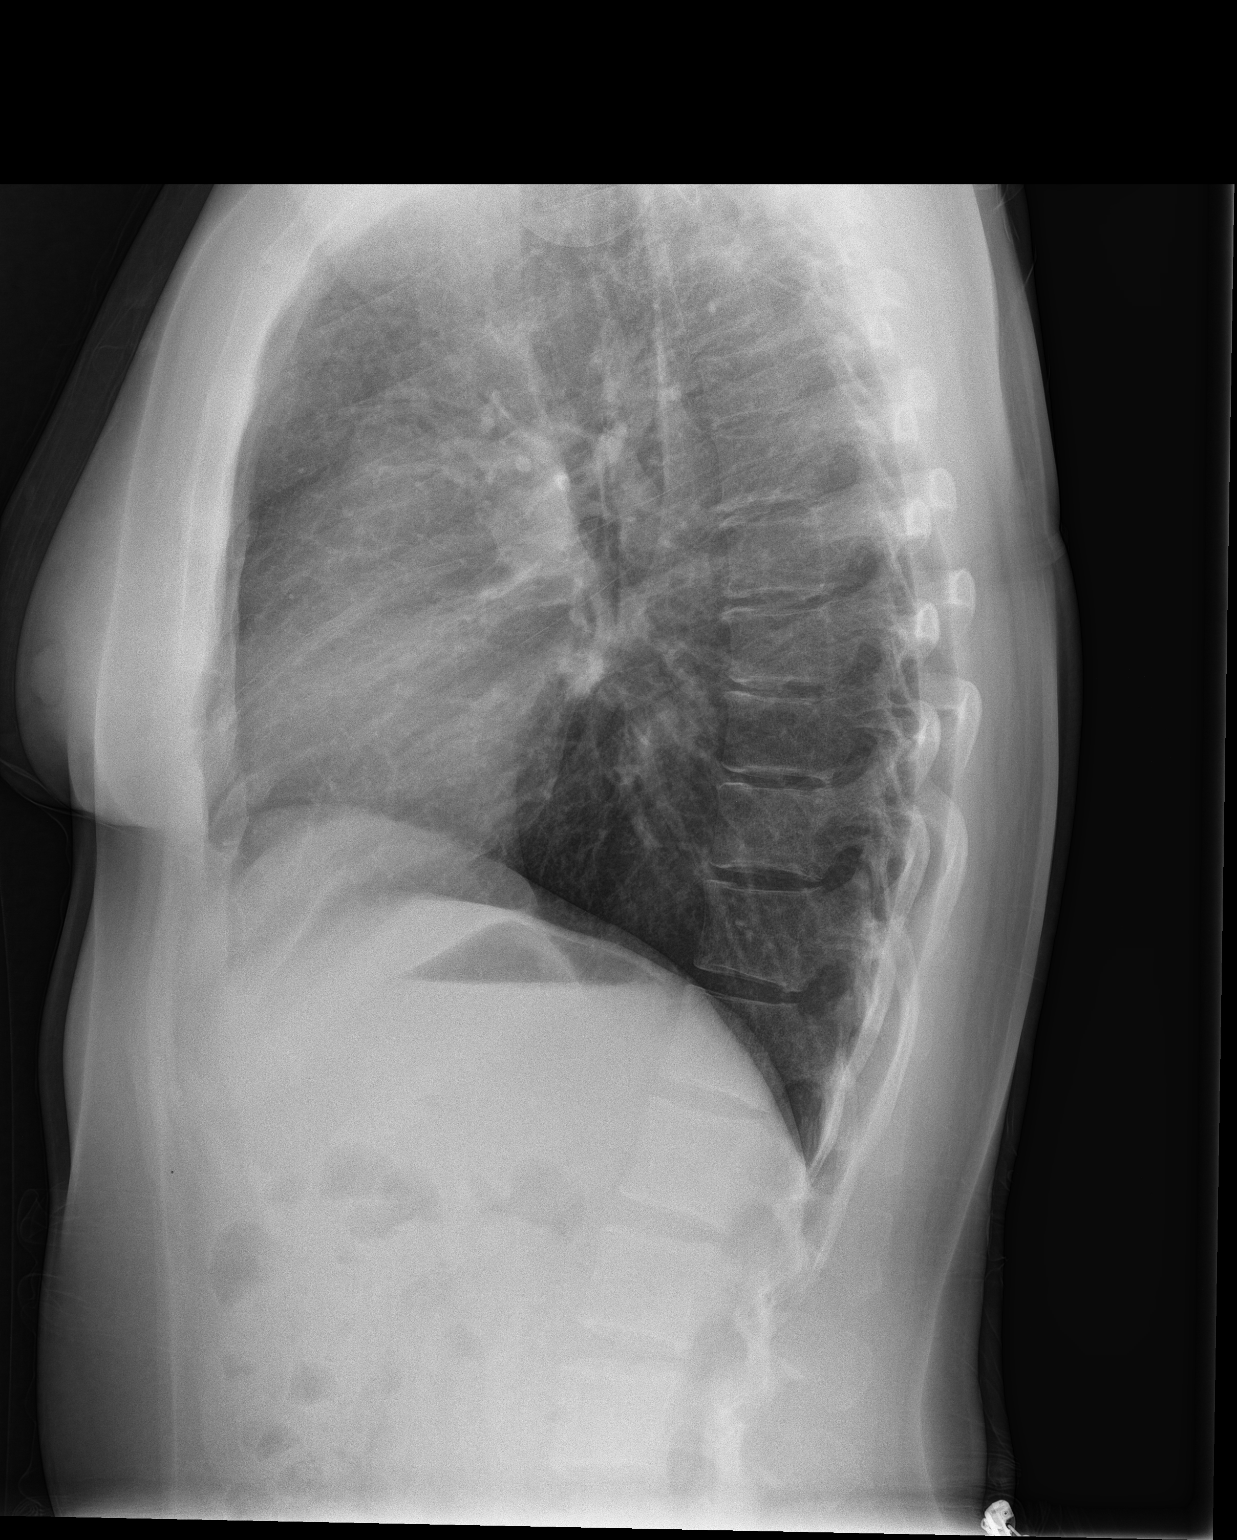

[2 of 2 positions shown; findings below may reference images not displayed]

FINDINGS: Stable cardiomediastinal silhouette with normal heart size. No
pneumothorax. No pleural effusion. Lungs appear clear, with no acute
consolidative airspace disease and no pulmonary edema.
IMPRESSION: No active cardiopulmonary disease.

## 2018-04-12 ENCOUNTER — Encounter: Payer: Self-pay | Admitting: Physician Assistant

## 2018-04-12 ENCOUNTER — Ambulatory Visit: Payer: Self-pay | Admitting: Physician Assistant

## 2018-04-12 VITALS — BP 126/86 | HR 95 | Temp 98.2°F | Ht 59.75 in | Wt 144.0 lb

## 2018-04-12 DIAGNOSIS — J209 Acute bronchitis, unspecified: Secondary | ICD-10-CM

## 2018-04-12 DIAGNOSIS — R062 Wheezing: Secondary | ICD-10-CM

## 2018-04-12 DIAGNOSIS — Z91199 Patient's noncompliance with other medical treatment and regimen due to unspecified reason: Secondary | ICD-10-CM

## 2018-04-12 DIAGNOSIS — Z9119 Patient's noncompliance with other medical treatment and regimen: Secondary | ICD-10-CM

## 2018-04-12 MED ORDER — PREDNISONE 20 MG PO TABS: ORAL_TABLET | ORAL | 0 refills | Status: DC

## 2018-04-12 MED ORDER — ALBUTEROL SULFATE (2.5 MG/3ML) 0.083% IN NEBU
2.5000 mg | INHALATION_SOLUTION | Freq: Once | RESPIRATORY_TRACT | Status: AC
  Administered 2018-04-12: 2.5 mg via RESPIRATORY_TRACT

## 2018-04-12 MED ORDER — AZITHROMYCIN 250 MG PO TABS: ORAL_TABLET | ORAL | 0 refills | Status: DC

## 2018-04-12 MED ORDER — ALBUTEROL SULFATE HFA 108 (90 BASE) MCG/ACT IN AERS: INHALATION_SPRAY | RESPIRATORY_TRACT | 3 refills | Status: DC

## 2018-04-12 NOTE — Progress Notes (Signed)
BP 126/86 (BP Location: Right Arm, Patient Position: Sitting, Cuff Size: Normal)   Pulse 95   Temp 98.2 F (36.8 C) (Other (Comment))   Ht 4' 11.75" (1.518 m)   Wt 144 lb (65.3 kg)   LMP 03/28/2018 (Exact Date)   SpO2 94%   BMI 28.36 kg/m    Subjective:    Patient ID: Desiree Thornton, female    DOB: 1981/03/25, 37 y.o.   MRN: [id number]  HPI: Desiree Thornton is a 37 y.o. female presenting on 04/12/2018 for Cough   HPI  Pt with cough, congestion, ST, EA, HA, and chest and back pain from coughing.  No fever.  Symptoms started 3 wk ago.  No self treatment.  Non-smoker.   Relevant past medical, surgical, family and social history reviewed and updated as indicated. Interim medical history since our last visit reviewed. Allergies and medications reviewed and updated.   Current Outpatient Medications:  .  albuterol (VENTOLIN HFA) 108 (90 Base) MCG/ACT inhaler, Inhale into the lungs every 6 (six) hours as needed for wheezing or shortness of breath., Disp: , Rfl:  .  Multiple Vitamin (MULTIVITAMIN) capsule, Take 1 capsule by mouth daily., Disp: , Rfl:    Review of Systems  Constitutional: Negative for appetite change, chills, diaphoresis, fatigue, fever and unexpected weight change.  HENT: Positive for sneezing, sore throat, trouble swallowing and voice change. Negative for congestion, dental problem, drooling, ear pain, facial swelling, hearing loss and mouth sores.   Eyes: Positive for pain, discharge and itching. Negative for redness and visual disturbance.  Respiratory: Positive for cough, shortness of breath and wheezing. Negative for choking.   Cardiovascular: Positive for chest pain. Negative for palpitations and leg swelling.  Gastrointestinal: Negative for abdominal pain, blood in stool, constipation, diarrhea and vomiting.  Endocrine: Negative for cold intolerance, heat intolerance and polydipsia.  Genitourinary: Negative for decreased urine volume,  dysuria and hematuria.  Musculoskeletal: Positive for back pain. Negative for arthralgias and gait problem.  Skin: Negative for rash.  Allergic/Immunologic: Positive for environmental allergies.  Neurological: Positive for headaches. Negative for seizures, syncope and light-headedness.  Hematological: Negative for adenopathy.  Psychiatric/Behavioral: Negative for agitation, dysphoric mood and suicidal ideas. The patient is not nervous/anxious.     Per HPI unless specifically indicated above     Objective:    BP 126/86 (BP Location: Right Arm, Patient Position: Sitting, Cuff Size: Normal)   Pulse 95   Temp 98.2 F (36.8 C) (Other (Comment))   Ht 4' 11.75" (1.518 m)   Wt 144 lb (65.3 kg)   LMP 03/28/2018 (Exact Date)   SpO2 94%   BMI 28.36 kg/m   Wt Readings from Last 3 Encounters:  04/12/18 144 lb (65.3 kg)  03/31/17 144 lb 8 oz (65.5 kg)  01/21/17 146 lb (66.2 kg)    Physical Exam  Constitutional: She is oriented to person, place, and time. She appears well-developed and well-nourished.  HENT:  Head: Normocephalic and atraumatic.  Right Ear: Hearing, tympanic membrane, external ear and ear canal normal.  Left Ear: Hearing, tympanic membrane, external ear and ear canal normal.  Nose: Nose normal.  Mouth/Throat: Uvula is midline and oropharynx is clear and moist. No oropharyngeal exudate.  Neck: Neck supple.  Cardiovascular: Normal rate and regular rhythm.  Pulmonary/Chest: Effort normal. No respiratory distress. She has wheezes. She has no rales.  Lymphadenopathy:    She has no cervical adenopathy.  Neurological: She is alert and oriented to person, place, and time.  Skin: Skin  is warm and dry.  Psychiatric: She has a normal mood and affect. Her behavior is normal.  Vitals reviewed.       Assessment & Plan:    Encounter Diagnoses  Name Primary?  . Acute bronchitis, unspecified organism Yes  . Wheezing   . Personal history of noncompliance with medical  treatment, presenting hazards to health     -rx albuterol inhaler, prednisone and zpack -Pt has not been seen in over a year.  She needs to rescreen and return for routine appointment if she wishes to continue to be a pt of [facility].  She states understanding.

## 2019-10-31 ENCOUNTER — Other Ambulatory Visit: Payer: Self-pay

## 2019-10-31 ENCOUNTER — Encounter: Payer: Self-pay | Admitting: Physician Assistant

## 2019-10-31 ENCOUNTER — Ambulatory Visit: Payer: Self-pay | Admitting: Physician Assistant

## 2019-10-31 DIAGNOSIS — Z789 Other specified health status: Secondary | ICD-10-CM

## 2019-10-31 DIAGNOSIS — Z7689 Persons encountering health services in other specified circumstances: Secondary | ICD-10-CM

## 2019-10-31 DIAGNOSIS — Z13220 Encounter for screening for lipoid disorders: Secondary | ICD-10-CM

## 2019-10-31 DIAGNOSIS — Z3009 Encounter for other general counseling and advice on contraception: Secondary | ICD-10-CM

## 2019-10-31 NOTE — Progress Notes (Signed)
There were no vitals taken for this visit.   Subjective:    Patient ID: Desiree Thornton, female    DOB: December 06, 1980, 38 y.o.   MRN: [id number]  HPI: Desiree Thornton is a 38 y.o. female presenting on 10/31/2019 for No chief complaint on file.   HPI  Patient was scheduled for in office appointment but she went OOT to [facility] so her appointment is changed to telemedicine visit today.  I connected with  Desiree Thornton on 10/31/19 by a video enabled telemedicine application and verified that I am speaking with the correct person using two identifiers.   I discussed the limitations of evaluation and management by telemedicine. The patient expressed understanding and agreed to proceed.  Pt is in her parked car in office parking lot.  Provider is inside office.    Pt is here to reestablish care-  Last seen here 03/31/17  She was going to [facility] after she stopped coming here- last seen there 1 year ago.   She thinks she was seen there maybe 2 times.   She feels well.    She went to [facility] in a car last Wednesday.   She was around her family and 3 people that she wentt to see in [facility].    She doesn't work.  Her last PAP in 2018 was normal  She walks regularly for exercise.   Her youngest child is 4yo.  She has 4 children.  She isn't using any birth control.  She does try to use the calendar method.      Relevant past medical, surgical, family and social history reviewed and updated as indicated. Interim medical history since our last visit reviewed. Allergies and medications reviewed and updated.    Current Outpatient Medications:  Marland Kitchen  Multiple Vitamin (MULTIVITAMIN) capsule, Take 1 capsule by mouth daily., Disp: , Rfl:    Review of Systems  Per HPI unless specifically indicated above     Objective:    There were no vitals taken for this visit.  Wt Readings from Last 3 Encounters:  04/12/18 144 lb (65.3 kg)  03/31/17 144 lb 8 oz (65.5 kg)   01/21/17 146 lb (66.2 kg)    Physical Exam Vitals signs reviewed.  Constitutional:      Appearance: She is well-developed.  HENT:     Head: Normocephalic and atraumatic.     Mouth/Throat:     Pharynx: No oropharyngeal exudate.  Eyes:     Conjunctiva/sclera: Conjunctivae normal.     Pupils: Pupils are equal, round, and reactive to light.  Neck:     Musculoskeletal: Neck supple.     Thyroid: No thyromegaly.  Cardiovascular:     Rate and Rhythm: Normal rate and regular rhythm.  Pulmonary:     Effort: Pulmonary effort is normal.     Breath sounds: Normal breath sounds.  Abdominal:     General: Bowel sounds are normal.     Palpations: Abdomen is soft. There is no mass.     Tenderness: There is no abdominal tenderness.  Lymphadenopathy:     Cervical: No cervical adenopathy.  Skin:    General: Skin is warm and dry.  Neurological:     Mental Status: She is alert and oriented to person, place, and time.     Gait: Gait normal.  Psychiatric:        Behavior: Behavior normal.     Results for orders placed or performed in visit on 01/21/17  Basic Metabolic Panel (BMET)  Result Value Ref Range   Sodium 138 135 - 146 mmol/L   Potassium 4.2 3.5 - 5.3 mmol/L   Chloride 104 98 - 110 mmol/L   CO2 23 20 - 31 mmol/L   Glucose, Bld 93 65 - 99 mg/dL   BUN 11 7 - 25 mg/dL   Creat 0.63 0.50 - 1.10 mg/dL   Calcium 9.1 8.6 - 10.2 mg/dL  CBC  Result Value Ref Range   WBC 4.6 3.8 - 10.8 K/uL   RBC 4.24 3.80 - 5.10 MIL/uL   Hemoglobin 12.4 11.7 - 15.5 g/dL   HCT 37.4 35.0 - 45.0 %   MCV 88.2 80.0 - 100.0 fL   MCH 29.2 27.0 - 33.0 pg   MCHC 33.2 32.0 - 36.0 g/dL   RDW 14.4 11.0 - 15.0 %   Platelets 330 140 - 400 K/uL   MPV 9.2 7.5 - 12.5 fL      Assessment & Plan:    Encounter Diagnoses  Name Primary?  . Encounter to establish care Yes  . Screening cholesterol level   . Not proficient in [place] language   . General counseling and advice on contraceptive management       Pt Declined IUD due to problems with hormones & side effects in the past She says she discussed BTL aftter her last child but she says they told her it would be $20K.  She says she would like.  To get that done-  [facility]   Screen lipids- she will be called with results  -will check into whether the [facility] care financial assistance would cover a BTL and if so will Refer to gyn for btl.  -Pt counseled to wear a mask to reduce risk of covid 19   F/u 6-8 months..she is to contact office sooner prn

## 2019-11-04 ENCOUNTER — Other Ambulatory Visit (HOSPITAL_COMMUNITY)
Admission: RE | Admit: 2019-11-04 | Discharge: 2019-11-04 | Disposition: A | Discharge status: Home / Self Care | Payer: Self-pay | Source: Ambulatory Visit | Attending: Physician Assistant | Admitting: Physician Assistant

## 2019-11-04 DIAGNOSIS — Z13220 Encounter for screening for lipoid disorders: Secondary | ICD-10-CM | POA: Insufficient documentation

## 2019-11-04 LAB — COMPREHENSIVE METABOLIC PANEL
ALT: 17 U/L (ref 0–44)
AST: 19 U/L (ref 15–41)
Albumin: 4 g/dL (ref 3.5–5.0)
Alkaline Phosphatase: 59 U/L (ref 38–126)
Anion gap: 7 (ref 5–15)
BUN: 11 mg/dL (ref 6–20)
CO2: 24 mmol/L (ref 22–32)
Calcium: 8.4 mg/dL — ABNORMAL LOW (ref 8.9–10.3)
Chloride: 109 mmol/L (ref 98–111)
Creatinine, Ser: 0.5 mg/dL (ref 0.44–1.00)
GFR calc Af Amer: >60 mL/min (ref >60)
GFR calc non Af Amer: >60 mL/min (ref >60)
Glucose, Bld: 108 mg/dL — ABNORMAL HIGH (ref 70–99)
Potassium: 4 mmol/L (ref 3.5–5.1)
Sodium: 140 mmol/L (ref 135–145)
Total Bilirubin: 1.3 mg/dL — ABNORMAL HIGH (ref 0.3–1.2)
Total Protein: 7.1 g/dL (ref 6.5–8.1)

## 2019-11-04 LAB — LIPID PANEL
Cholesterol: 178 mg/dL (ref 0–200)
HDL: 52 mg/dL (ref >40)
LDL Cholesterol: 116 mg/dL — ABNORMAL HIGH (ref 0–99)
Total CHOL/HDL Ratio: 3.4 RATIO
Triglycerides: 52 mg/dL (ref <150)
VLDL: 10 mg/dL (ref 0–40)

## 2020-03-19 ENCOUNTER — Other Ambulatory Visit: Payer: Self-pay

## 2020-03-19 ENCOUNTER — Encounter: Payer: Self-pay | Admitting: Physician Assistant

## 2020-03-19 ENCOUNTER — Ambulatory Visit: Payer: Self-pay | Admitting: Physician Assistant

## 2020-03-19 VITALS — BP 132/94 | HR 105 | Temp 99.0°F | Wt 140.9 lb

## 2020-03-19 DIAGNOSIS — Z789 Other specified health status: Secondary | ICD-10-CM

## 2020-03-19 DIAGNOSIS — L255 Unspecified contact dermatitis due to plants, except food: Secondary | ICD-10-CM

## 2020-03-19 MED ORDER — PREDNISONE 10 MG PO TABS: ORAL_TABLET | ORAL | 0 refills | Status: DC

## 2020-03-19 NOTE — Patient Instructions (Signed)
Dias 1-2 tome 6 tabletas por boca por la manana. Dias 3-4 tome 5 tabletas por boca por la manana. Dias 5-6 tome 4 tabletas por la manana. Dias 7-8 tome 3 tabletas por la Standley Dakins, Brielle 9-10 tome 2 tabletas por la Ninfa Meeker 11-12 tome 1 tableta por boca por la Standley Dakins ----------------------------------------------------------    Dermatitis por hiedra venenosa Poison Ivy Dermatitis La dermatitis por hiedra venenosa es la inflamacin de la piel que se produce a causa de sustancias qumicas de las hojas de dicha planta. La reaccin de la piel a menudo incluye enrojecimiento, hinchazn, ampollas y Financial risk analyst. Cules son las causas? Esta afeccin se produce por una sustancia qumica (urushiol) que se encuentra en la savia de la hiedra venenosa. La sustancia qumica es pegajosa y puede transmitirse fcilmente a personas, animales y [place]. Puede contraer dermatitis por hiedra venenosa en cualquiera de estos casos:  Tiene contacto directo con una planta de hiedra venenosa.  Toca animales, personas u objetos que han estado en contacto con la hiedra venenosa y en los que ha quedado la sustancia qumica. Qu incrementa el riesgo? Es ms probable que Dietitian en las personas que presentan alguna de estas caractersticas:  Estn al Guadalupe Dawn a menudo en zonas boscosas o pantanosas.  Estn al [employer] sin ropa de proteccin, como calzado cerrado, pantalones largos y camisa de Data processing manager. Cules son los signos o los sntomas? Los sntomas de esta afeccin incluyen:  Piel enrojecida.  Una gran picazn.  Una erupcin cutnea con protuberancias y ampollas. Por lo general, la erupcin cutnea aparece 48 horas despus de la exposicin, si ha estado expuesto antes. Si es la primera vez que ha estado expuesto, es posible que la erupcin cutnea no aparezca hasta una semana despus de la exposicin.  Hinchazn. Puede ocurrir si la reaccin es ms grave. Por lo general, los  sntomas duran de 1 a 2semanas. Sin embargo, la primera vez que la afeccin aparece, los sntomas pueden durar entre 3 y [place]. Cmo se diagnostica? Esta afeccin se puede diagnosticar en funcin de los sntomas y de un examen fsico. El mdico tambin puede hacerle preguntas sobre cualquier actividad reciente al [place]. Cmo se trata? El tratamiento de esta afeccin variar en funcin de la gravedad. El tratamiento puede incluir:  Cremas con hidrocortisona o lociones con calamina para Associate Professor.  Baos de avena para calmar la piel.  Medicamentos como comprimidos antihistamnicos de Sales promotion account executive.  Corticoesteroides por va oral para tratar reacciones ms graves. Siga estas instrucciones en su casa: Medicamentos  Tome o aplquese los medicamentos de venta libre y los recetados solamente como se lo haya indicado el mdico.  Utilice una crema con hidrocortisona o una locin con calamina para calmar la piel y Technical sales engineer picazn, tanto y como sea necesario. Instrucciones generales  No se rasque ni se frote la piel.  Aplique un pao fro y hmedo (compresa fra) en las zonas afectadas o tome baos de agua fra. Esto lo ayudar a Associate Professor. Evite baos y duchas calientes.  Tome baos de avena segn sea necesario. Use avena coloidal. Puede conseguirla en la tienda de comestibles o la farmacia local. Siga las instrucciones del envase.  Mientras tenga erupcin cutnea, lave las prendas que use inmediatamente despus de sacrselas.  Concurra a todas las visitas de [address] se lo haya indicado el mdico. Esto es importante. Cmo se evita?   Aprenda a identificar la hiedra venenosa y evite el  contacto con la planta. Esta planta puede reconocerse por la cantidad de hojas. En general, la hiedra venenosa tiene tres hojas, con ramas que florecen de un solo tallo. Las hojas suelen ser brillantes y tienen bordes irregulares que terminan en una punta en el  frente.  Si ha estado expuesto a la hiedra venenosa, lvese muy bien con agua y [place] de inmediato. Tiene alrededor de 30minutos para eliminar la resina de la planta antes de que le cause una erupcin cutnea. Asegrese de lavarse debajo de las uas de las [place], porque todo resto de resina seguir diseminando la erupcin cutnea.  Al hacer excursiones o ir de campamento, use ropa que lo ayude a evitar la exposicin de la piel. Esto incluye pantalones largos, camisa de [employer], medias altas y botas para caminar. Tambin puede aplicarse una locin preventiva en la piel, que lo ayudar a limitar la exposicin.  Si sospecha que su ropa o el equipo especfico para el aire libre entraron en contacto con una hiedra venenosa, enjuguelos con una manguera de jardn antes de llevarlos a su casa.  Cuando trabaje en el jardn o haga jardinera, use guantes, mangas largas, pantalones largos y botas. [place] herramientas de jardn y los guantes si estn en contacto con la hiedra venenosa.  Si sospecha que su mascota ha entrado en contacto con la hiedra venenosa, lave al animal con champ para mascotas y [place]. Use guantes mientras lave a la mascota. Comunquese con un mdico si tiene:  lceras abiertas en la zona de la erupcin cutnea.  Ms enrojecimiento, hinchazn o dolor en la zona afectada.  Enrojecimiento que se extiende ms all de la zona de la erupcin cutnea.  Lquido, sangre o pus que emana de la zona afectada.  Cristy Hilts.  Una erupcin cutnea en una zona extensa del cuerpo.  Una erupcin cutnea en los ojos, la boca o los genitales.  Una erupcin cutnea que no mejora despus de unas semanas. Solicite ayuda inmediatamente si:  Se le hincha la cara o se le hinchan los prpados al punto de cerrarse.  Tiene dificultad para respirar.  Tiene dificultad para tragar. Estos sntomas pueden representar un problema grave que constituye Engineer, maintenance (IT). No espere a ver si los sntomas  desaparecen. Solicite atencin mdica de inmediato. Comunquese con el servicio de emergencias de su localidad (911 en los [place]). No conduzca por sus propios medios [employer] hospital. Resumen  La dermatitis por hiedra venenosa es la inflamacin de la piel que se produce a causa de sustancias qumicas de las hojas de dicha planta.  Los sntomas de esta afeccin incluyen enrojecimiento, picazn, erupcin cutnea e hinchazn.  No se rasque ni se frote la piel.  Tome o aplquese los medicamentos de venta libre y los recetados solamente como se lo haya indicado el mdico. Esta informacin no tiene Marine scientist el consejo del mdico. Asegrese de hacerle al mdico cualquier pregunta que tenga. Document Revised: 12/10/2018 Document Reviewed: 12/10/2018 Elsevier Patient Education  [place].

## 2020-03-19 NOTE — Progress Notes (Signed)
   BP (!) 132/94   Pulse (!) 105   Temp 99 F (37.2 C)   Wt 140 lb 14.4 oz (63.9 kg)   SpO2 99%   BMI 27.75 kg/m    Subjective:    Patient ID: Desiree Thornton, female    DOB: 09-Aug-1981, 39 y.o.   MRN: [id number]  HPI: Desiree Thornton is a 39 y.o. female presenting on 03/19/2020 for Facial Swelling (pt states she took benadryl on Friday night to help with her seasonal allergies and woke up saturday morning with her eyes and face swollen and itchy. pt states no SOB, cough, or difficulty breathing. pt also has some poison oak that started on Friday)   HPI   Pt had a negative covid 19 screening questionnaire.    Contact allergy as above. Symptoms started after working in yard and pulling weeds.   Pt denies sob.    She does not wear corrective lenses and has had no problems with her vision.        Relevant past medical, surgical, family and social history reviewed and updated as indicated. Interim medical history since our last visit reviewed. Allergies and medications reviewed and updated.   Current Outpatient Medications:  Marland Kitchen  Multiple Vitamin (MULTIVITAMIN) capsule, Take 1 capsule by mouth daily., Disp: , Rfl:  .  Phenyleph-Doxylamine-DM-APAP (ALKA SELTZER PLUS PO), Take by mouth., Disp: , Rfl:     Review of Systems  Per HPI unless specifically indicated above     Objective:    BP (!) 132/94   Pulse (!) 105   Temp 99 F (37.2 C)   Wt 140 lb 14.4 oz (63.9 kg)   SpO2 99%   BMI 27.75 kg/m   Wt Readings from Last 3 Encounters:  03/19/20 140 lb 14.4 oz (63.9 kg)  04/12/18 144 lb (65.3 kg)  03/31/17 144 lb 8 oz (65.5 kg)    Physical Exam Vitals and nursing note reviewed.  Constitutional:      General: She is not in acute distress.    Appearance: She is not ill-appearing or toxic-appearing.  HENT:     Head: Normocephalic and atraumatic.     Comments: Some mild peri-orbital swelling seen bilaterally.  No oral swelling.  Eyes:   Extraocular Movements: Extraocular movements intact.     Conjunctiva/sclera: Conjunctivae normal.  Pulmonary:     Effort: Pulmonary effort is normal. No tachypnea or respiratory distress.     Breath sounds: Normal breath sounds. No stridor. No wheezing.  Skin:    General: Skin is warm and dry.  Neurological:     Mental Status: She is alert and oriented to person, place, and time.  Psychiatric:        Behavior: Behavior normal.            Assessment & Plan:    Encounter Diagnoses  Name Primary?  . Rhus dermatitis Yes  . Not proficient in English language       rx prednisone taper and OTC antihistamine Pt to contact office for worsening or if fails to resovle

## 2020-08-13 ENCOUNTER — Ambulatory Visit: Payer: Self-pay | Admitting: Physician Assistant

## 2023-04-20 ENCOUNTER — Emergency Department (HOSPITAL_COMMUNITY)
Admission: EM | Admit: 2023-04-20 | Discharge: 2023-04-21 | Disposition: A | Discharge status: Home / Self Care | Payer: Self-pay | Attending: Emergency Medicine | Admitting: Emergency Medicine

## 2023-04-20 ENCOUNTER — Emergency Department (HOSPITAL_BASED_OUTPATIENT_CLINIC_OR_DEPARTMENT_OTHER): Admit: 2023-04-20 | Discharge: 2023-04-20 | Disposition: A | Discharge status: Home / Self Care | Payer: Self-pay

## 2023-04-20 ENCOUNTER — Other Ambulatory Visit: Payer: Self-pay

## 2023-04-20 ENCOUNTER — Encounter (HOSPITAL_COMMUNITY): Payer: Self-pay

## 2023-04-20 DIAGNOSIS — Z23 Encounter for immunization: Secondary | ICD-10-CM | POA: Insufficient documentation

## 2023-04-20 DIAGNOSIS — Z79899 Other long term (current) drug therapy: Secondary | ICD-10-CM | POA: Insufficient documentation

## 2023-04-20 DIAGNOSIS — M7989 Other specified soft tissue disorders: Secondary | ICD-10-CM

## 2023-04-20 DIAGNOSIS — I10 Essential (primary) hypertension: Secondary | ICD-10-CM | POA: Insufficient documentation

## 2023-04-20 DIAGNOSIS — D649 Anemia, unspecified: Secondary | ICD-10-CM | POA: Insufficient documentation

## 2023-04-20 DIAGNOSIS — L03115 Cellulitis of right lower limb: Secondary | ICD-10-CM | POA: Insufficient documentation

## 2023-04-20 LAB — CBC WITH DIFFERENTIAL/PLATELET
Abs Immature Granulocytes: 0.02 10*3/uL (ref 0.00–0.07)
Basophils Absolute: 0.1 10*3/uL (ref 0.0–0.1)
Basophils Relative: 1 %
Eosinophils Absolute: 0.2 10*3/uL (ref 0.0–0.5)
Eosinophils Relative: 3 %
HCT: 32.8 % — ABNORMAL LOW (ref 36.0–46.0)
Hemoglobin: 9.8 g/dL — ABNORMAL LOW (ref 12.0–15.0)
Immature Granulocytes: 0 %
Lymphocytes Relative: 32 %
Lymphs Abs: 2 10*3/uL (ref 0.7–4.0)
MCH: 22 pg — ABNORMAL LOW (ref 26.0–34.0)
MCHC: 29.9 g/dL — ABNORMAL LOW (ref 30.0–36.0)
MCV: 73.5 fL — ABNORMAL LOW (ref 80.0–100.0)
Monocytes Absolute: 0.5 10*3/uL (ref 0.1–1.0)
Monocytes Relative: 9 %
Neutro Abs: 3.4 10*3/uL (ref 1.7–7.7)
Neutrophils Relative %: 55 %
Platelets: 433 10*3/uL — ABNORMAL HIGH (ref 150–400)
RBC: 4.46 MIL/uL (ref 3.87–5.11)
RDW: 17.5 % — ABNORMAL HIGH (ref 11.5–15.5)
WBC: 6.1 10*3/uL (ref 4.0–10.5)
nRBC: 0 % (ref 0.0–0.2)

## 2023-04-20 LAB — BASIC METABOLIC PANEL
Anion gap: 11 (ref 5–15)
BUN: 10 mg/dL (ref 6–20)
CO2: 24 mmol/L (ref 22–32)
Calcium: 8.5 mg/dL — ABNORMAL LOW (ref 8.9–10.3)
Chloride: 102 mmol/L (ref 98–111)
Creatinine, Ser: 0.66 mg/dL (ref 0.44–1.00)
GFR, Estimated: >60 mL/min (ref >60)
Glucose, Bld: 118 mg/dL — ABNORMAL HIGH (ref 70–99)
Potassium: 3.3 mmol/L — ABNORMAL LOW (ref 3.5–5.1)
Sodium: 137 mmol/L (ref 135–145)

## 2023-04-20 NOTE — ED Provider Triage Note (Addendum)
Emergency Medicine Provider Triage Evaluation Note  Desiree Thornton , a 42 y.o. female  was evaluated in triage.  Patient here with right leg redness for the past 5 days.  Her husband works in a barn and she went to take him lunch and felt something bite her.  She was around a lot of manure and wonders if something got inside.  She was seen at urgent care and prescribed clindamycin but ultimately recommended to come to the emergency department.  No history of DVT/PE, recent travel or surgery.  No OCPs Review of Systems  Positive: Negative:   Physical Exam  BP (!) 143/100 (BP Location: Left Arm)   Pulse 83   Temp 98.1 F (36.7 C) (Oral)   Resp 16   Ht 5\' 1"  (1.549 m)   Wt 63.5 kg   SpO2 100%   BMI 26.45 kg/m  Gen:   Awake, no distress   Resp:  Normal effort  MSK:   Moves extremities without difficulty  Other:  Swelling and cellulitic skin to the right shin.  Medical Decision Making  Medically screening exam initiated at 5:21 PM.  Appropriate orders placed.  Desiree Thornton was informed that the remainder of the evaluation will be completed by another provider, this initial triage assessment does not replace that evaluation, and the importance of remaining in the ED until their evaluation is complete.     Desiree Benders, PA-C 04/20/23 1722    Desiree Benders, PA-C 04/20/23 1723

## 2023-04-20 NOTE — ED Triage Notes (Signed)
Pt sent by urgent care for further evaluation of redness and swelling to R lower leg; symptoms since Wednesday; also endorsing swelling to upper R leg

## 2023-04-20 NOTE — ED Notes (Signed)
Patient arrived to room via wc from lobby Was sent by UC for doppler to r/o Dvt as she states she has had right leg pain and swelling since last Wednesday.Labs and doppler done in triage pt awaiting re evaluation. CMS intact able to bare wt full rom

## 2023-04-20 NOTE — Progress Notes (Signed)
Right lower extremity venous duplex has been completed. Preliminary results can be found in CV Proc through chart review.  Results were given to [facility] [place] PA.  04/20/23 6:03 PM Olen Cordial RVT

## 2023-04-21 MED ORDER — TETANUS-DIPHTH-ACELL PERTUSSIS 5-2.5-18.5 LF-MCG/0.5 IM SUSY
0.5000 mL | PREFILLED_SYRINGE | Freq: Once | INTRAMUSCULAR | Status: AC
  Administered 2023-04-21: 0.5 mL via INTRAMUSCULAR
  Filled 2023-04-21: qty 0.5

## 2023-04-21 NOTE — ED Notes (Signed)
Pt right leg has redness just above ankle on medial shin running from ankle half way up calf also some dry flaky area laterally that looks possibly like dermatits

## 2023-04-21 NOTE — ED Provider Notes (Signed)
EMERGENCY DEPARTMENT AT [facility] Provider Note  CSN: [id number] Arrival date & time: 04/20/23 1630  Chief Complaint(s) No chief complaint on file.  HPI Desiree Thornton is a 42 y.o. female patient presents for right lower leg pain, swelling and redness.  This began 4 to 5 days ago.  She reports having a excoriation on the lower leg.  She noted redness that progressively got worse over the weekend.  She went to urgent care who diagnosed her with cellulitis.  She received IM Rocephin and was prescribed clindamycin.  She was sent here to rule out DVT.  The history is provided by the patient.    Past Medical History Past Medical History:  Diagnosis Date   Bronchitis    Hypertension    Patient Active Problem List   Diagnosis Date Noted   Gestational hypertension 01/14/2015   Home Medication(s) Prior to Admission medications   Medication Sig Start Date End Date Taking? Authorizing Provider  Multiple Vitamin (MULTIVITAMIN) capsule Take 1 capsule by mouth daily.    [provider]  Phenyleph-Doxylamine-DM-APAP (ALKA SELTZER PLUS PO) Take by mouth.    [provider]  predniSONE (DELTASONE) 10 MG tablet Days 1-2 take 6 po qam. Days 3-4 take 5 po qam. Days 5-6 take 4 po qam. Days 7-8 take 3 po qam. Days 9-10 take 2 po qam. Days 11-12 take 1 po qam.  Dias 1-2 tome 6 tabletas por boca por la manana. Dias 3-4 tome 5 tabletas por boca por la manana. Dias 5-6 tome 4 tabletas por la manana. Dias 7-8 tome 3 tabletas por la [place], [place] 9-10 tome 2 tabletas por la [place] 11-12 tome 1 tableta por boca por la manana 03/19/20   Jacquelin Hawking, PA-C                                                                                                                                    Allergies Patient has no known allergies.  Review of Systems Review of Systems As noted in HPI  Physical Exam Vital Signs  I have reviewed the triage vital signs BP  (!) 133/96   Pulse 65   Temp 98.6 F (37 C) (Oral)   Resp 16   Ht 5\' 1"  (1.549 m)   Wt 63.5 kg   SpO2 100%   BMI 26.45 kg/m   Physical Exam Vitals reviewed.  Constitutional:      General: She is not in acute distress.    Appearance: She is well-developed. She is not diaphoretic.  HENT:     Head: Normocephalic and atraumatic.     Right Ear: External ear normal.     Left Ear: External ear normal.     Nose: Nose normal.  Eyes:     General: No scleral icterus.    Conjunctiva/sclera: Conjunctivae normal.  Neck:     Trachea: Phonation normal.  Cardiovascular:  Rate and Rhythm: Normal rate and regular rhythm.  Pulmonary:     Effort: Pulmonary effort is normal. No respiratory distress.     Breath sounds: No stridor.  Abdominal:     General: There is no distension.  Musculoskeletal:        General: Normal range of motion.     Cervical back: Normal range of motion.     Right lower leg: Swelling (mild) and tenderness present.       Legs:     Comments: Erythema to anterior RLE. TTP  Neurological:     Mental Status: She is alert and oriented to person, place, and time.  Psychiatric:        Behavior: Behavior normal.     ED Results and Treatments Labs (all labs ordered are listed, but only abnormal results are displayed) Labs Reviewed  CBC WITH DIFFERENTIAL/PLATELET - Abnormal; Notable for the following components:      Result Value   Hemoglobin 9.8 (*)    HCT 32.8 (*)    MCV 73.5 (*)    MCH 22.0 (*)    MCHC 29.9 (*)    RDW 17.5 (*)    Platelets 433 (*)    All other components within normal limits  BASIC METABOLIC PANEL - Abnormal; Notable for the following components:   Potassium 3.3 (*)    Glucose, Bld 118 (*)    Calcium 8.5 (*)    All other components within normal limits                                                                                                                         EKG  EKG Interpretation  Date/Time:    Ventricular Rate:    PR  Interval:    QRS Duration:   QT Interval:    QTC Calculation:   R Axis:     Text Interpretation:         Radiology VAS [place] LOWER EXTREMITY VENOUS (DVT) (7a-7p)  Result Date: 04/20/2023  Lower Venous DVT Study Patient Name:  Desiree Thornton  Date of Exam:   04/20/2023 Medical Rec #: [id number]                   Accession #:    [id number] Date of Birth: [birth year]                   Patient Gender: F Patient Age:   52 years Exam Location:  [facility] Procedure:      VAS [place] LOWER EXTREMITY VENOUS (DVT) Referring Phys: MADISON REDWINE --------------------------------------------------------------------------------  Indications: Swelling.  Risk Factors: None identified. Comparison Study: No prior studies. Performing Technologist: Chanda Busing RVT  Examination Guidelines: A complete evaluation includes B-mode imaging, spectral Doppler, color Doppler, and power Doppler as needed of all accessible portions of each vessel. Bilateral testing is considered an integral part of a complete examination. Limited examinations for reoccurring indications may be performed as noted. The reflux portion of  the exam is performed with the patient in reverse Trendelenburg.  +---------+---------------+---------+-----------+----------+--------------+ RIGHT    CompressibilityPhasicitySpontaneityPropertiesThrombus Aging +---------+---------------+---------+-----------+----------+--------------+ CFV      Full           Yes      Yes                                 +---------+---------------+---------+-----------+----------+--------------+ SFJ      Full                                                        +---------+---------------+---------+-----------+----------+--------------+ FV Prox  Full                                                        +---------+---------------+---------+-----------+----------+--------------+ FV Mid   Full                                                         +---------+---------------+---------+-----------+----------+--------------+ FV DistalFull                                                        +---------+---------------+---------+-----------+----------+--------------+ PFV      Full                                                        +---------+---------------+---------+-----------+----------+--------------+ POP      Full           Yes      Yes                                 +---------+---------------+---------+-----------+----------+--------------+ PTV      Full                                                        +---------+---------------+---------+-----------+----------+--------------+ PERO     Full                                                        +---------+---------------+---------+-----------+----------+--------------+   +----+---------------+---------+-----------+----------+--------------+ LEFTCompressibilityPhasicitySpontaneityPropertiesThrombus Aging +----+---------------+---------+-----------+----------+--------------+ CFV Full           Yes      Yes                                 +----+---------------+---------+-----------+----------+--------------+  Summary: RIGHT: - There is no evidence of deep vein thrombosis in the lower extremity.  - No cystic structure found in the popliteal fossa.  LEFT: - No evidence of common femoral vein obstruction.  *See table(s) above for measurements and observations.    Preliminary     Medications Ordered in ED Medications  Tdap (BOOSTRIX) injection 0.5 mL (0.5 mLs Intramuscular Given 04/21/23 0030)   Procedures Procedures  (including critical care time) Medical Decision Making / ED Course  Click here for ABCD2, HEART and other calculators  Medical Decision Making Risk Prescription drug management.    The patient presents to the ED for the following: Right leg pain  Key initial findings: Anterior right leg erythema  Additional  history obtained: Documentation from [place] urgent care documenting cellulitis.  Patient was given a dose of Rocephin IM and prescribed clindamycin  This involves an extensive number of treatment options, and is a complaint that carries with it a high risk of complications and morbidity. The differential diagnosis includes but not limited to:  Cellulitis, superficial thrombophlebitis.  Less suspicious for DVT but sent here to rule it out.  [place] was ordered in triage process and completed prior to my assessment   Work up Interpretation and Management:  Laboratory Tests ordered listed below with my independent interpretation: CBC without leukocytosis.  Anemia with a hemoglobin of 9.8 which is down from 12, 6 years ago. CBC without significant electrolyte derangements or renal insufficiency.    Imaging Studies ordered listed below with my independent interpretation: Ultrasound negative for DVT.  ED Course: Consistent with cellulitis.  Patient already on clindamycin which was prescribed to her on 5/20.  Educated on bacteriostatic properties of antibiotic and expected timeframe for improvement.      Final Clinical Impression(s) / ED Diagnoses Final diagnoses:  Cellulitis of right leg   The patient appears reasonably screened and/or stabilized for discharge and I doubt any other medical condition or other [facility] requiring further screening, evaluation, or treatment in the ED at this time. I have discussed the findings, Dx and Tx plan with the patient/family who expressed understanding and agree(s) with the plan. Discharge instructions discussed at length. The patient/family was given strict return precautions who verbalized understanding of the instructions. No further questions at time of discharge.  Disposition: Discharge  Condition: Good  ED Discharge Orders     None                This chart was dictated using voice recognition software.  Despite best efforts to proofread,   errors can occur which can change the documentation meaning.    Nira Conn, MD 04/21/23 [phone]

## 2024-03-09 LAB — GLUCOSE, POCT (MANUAL RESULT ENTRY): POC Glucose: 94 mg/dL (ref 70–99)

## 2024-03-09 NOTE — Congregational Nurse Program (Signed)
 Pt was followed up with a  health interview/assessment upon completing their enrollment into the [facility] Program on today (4.9.25) .     Pt states they have not seen a PCP in approximately 1 year [facility] 03/2023) .  States she has not noticed any body aches, but more so runny nose and sneezing mostly  Chief Reasons Needed for PCP - Re-Establish Care -Allergy Sympotoms approximately one (1) week runny nose sneezing (admits to taking Zyrtec and Allegra but very little improvement)   Past /Current Medical History (stated by patient) -Blood transfusion -Asthma (COPD) -Hx of gestational hypertension- no home self-monitoring to date  Socio-determinants health needs identified: Food Insecurity: Introduced to Research officer, trade union at [employer] to tour..  Pt did choose to shop for food during today's visit  PHQ-9= 1 (LOW) no behavioral health concerns upon review with patient outside of medical concerns on today that is contributing to her low energy  Completed During today's Visit -Conducted health screenings for diabetes and hypertension along with basic vital signs during visit   (BP Screenings - ABN  and Blood Glucose-WNL as nonfasting)  -Pt was offered to complete a COVID test on today during visit, however the pt DECLINED   -Pt completed enrollment into the [facility] Program on today (4.9.25)) and pt was approved with eligibility thru 4.9.26 by Tami Lin ([facility] Care Guide)  -Reviewed "Get Care Now" steps and facilities to utilize when non-emergent vs emergent conditions arise   -Scheduled Appointment for first medical visit scheduled for [facility] on Thursday, Aprll 10,2025 @ 8:00am  Plans  -Continous nurse case management upon completion of first medical appointment and thereafter will be maintained with patient while pt is enrolled into [facility] program

## 2024-03-10 ENCOUNTER — Encounter: Payer: Self-pay | Admitting: Physician Assistant

## 2024-03-10 ENCOUNTER — Ambulatory Visit: Payer: Self-pay | Admitting: Physician Assistant

## 2024-03-10 VITALS — BP 147/90 | HR 101 | Temp 98.3°F | Ht 60.25 in | Wt 146.2 lb

## 2024-03-10 DIAGNOSIS — Z789 Other specified health status: Secondary | ICD-10-CM

## 2024-03-10 DIAGNOSIS — R030 Elevated blood-pressure reading, without diagnosis of hypertension: Secondary | ICD-10-CM

## 2024-03-10 DIAGNOSIS — Z7689 Persons encountering health services in other specified circumstances: Secondary | ICD-10-CM

## 2024-03-10 DIAGNOSIS — J069 Acute upper respiratory infection, unspecified: Secondary | ICD-10-CM

## 2024-03-10 NOTE — Progress Notes (Signed)
 BP (!) 147/90   Pulse (!) 101   Temp 98.3 F (36.8 C)   Ht 5' 0.25" (1.53 m)   Wt 146 lb 4 oz (66.3 kg)   SpO2 99%   BMI 28.33 kg/m    Subjective:    Patient ID: Desiree Thornton, female    DOB: November 10, 1981, 43 y.o.   MRN: [id number]  HPI: Desiree Thornton is a 43 y.o. female presenting on 03/10/2024 for New Patient (Initial Visit) (Pt is here to reestablish care, pt states she was dismissed for No Shows and then was seen at [facility]. Pt was last seen at [facility] more than a year ago. Pt states she use to have HTN, but doesn't anymore. Pt last took medication for this about 2 years ago.) and Cough (Pt presents with cough, runny nose, HA, sub. fever, congestion, and body aches for the past 2 days. pt takes tylenol and nyquil which help some.)   HPI   Chief Complaint  Patient presents with   New Patient (Initial Visit)    Pt is here to reestablish care, pt states she was dismissed for No Shows and then was seen at [facility]. Pt was last seen at [facility] more than a year ago. Pt states she use to have HTN, but doesn't anymore. Pt last took medication for this about 2 years ago.   Cough    Pt presents with cough, runny nose, HA, sub. fever, congestion, and body aches for the past 2 days. pt takes tylenol and nyquil which help some.      Pt Last seen here 03/19/2020  She has been sick x 3 days.  [facility].  She says it's allergies.  She is taking allegra.    She never got a covid vaccination.  She never got a mammogram.     Relevant past medical, surgical, family and social history reviewed and updated as indicated. Interim medical history since our last visit reviewed. Allergies and medications reviewed and updated.   Current Outpatient Medications:    acetaminophen (TYLENOL) 325 MG tablet, Take 650 mg by mouth every 6 (six) hours as needed., Disp: , Rfl:    DM-Doxylamine-Acetaminophen (NYQUIL  COLD & FLU PO), Take by mouth., Disp: , Rfl:    fexofenadine (ALLEGRA) 180 MG tablet, Take 180 mg by mouth daily., Disp: , Rfl:     Review of Systems  Per HPI unless specifically indicated above     Objective:    BP (!) 147/90   Pulse (!) 101   Temp 98.3 F (36.8 C)   Ht 5' 0.25" (1.53 m)   Wt 146 lb 4 oz (66.3 kg)   SpO2 99%   BMI 28.33 kg/m   Wt Readings from Last 3 Encounters:  03/10/24 146 lb 4 oz (66.3 kg)  04/20/23 140 lb (63.5 kg)  03/19/20 140 lb 14.4 oz (63.9 kg)    Physical Exam Vitals reviewed.  Constitutional:      General: She is not in acute distress.    Appearance: She is well-developed. She is not toxic-appearing.  HENT:     Head: Normocephalic and atraumatic.     Right Ear: Tympanic membrane, ear canal and external ear normal.     Left Ear: Tympanic membrane, ear canal and external ear normal.     Nose: Congestion and rhinorrhea present.  Eyes:     Conjunctiva/sclera: Conjunctivae normal.  Cardiovascular:     Rate and Rhythm: Normal  rate and regular rhythm.  Pulmonary:     Effort: Pulmonary effort is normal.     Breath sounds: Normal breath sounds.  Abdominal:     General: Bowel sounds are normal.     Palpations: Abdomen is soft. There is no mass.     Tenderness: There is no abdominal tenderness.  Musculoskeletal:     Cervical back: Neck supple.     Right lower leg: No edema.     Left lower leg: No edema.  Lymphadenopathy:     Cervical: No cervical adenopathy.  Skin:    General: Skin is warm and dry.  Neurological:     Mental Status: She is alert and oriented to person, place, and time.  Psychiatric:        Behavior: Behavior normal.           Assessment & Plan:     Encounter Diagnoses  Name Primary?   Encounter to establish care Yes   Upper respiratory tract infection, unspecified type    Elevated blood pressure reading    Not proficient in English language       Pt counseled on URI care including rest, fluids, OTCs  prn.  Discussed with pt that it might be covid or it might be common cold.  Pt will RTO 2-3 weeks for routine care.  She is to contact office sooner prn

## 2024-03-14 ENCOUNTER — Other Ambulatory Visit: Payer: Self-pay | Admitting: Physician Assistant

## 2024-03-14 DIAGNOSIS — Z131 Encounter for screening for diabetes mellitus: Secondary | ICD-10-CM

## 2024-03-14 DIAGNOSIS — R7309 Other abnormal glucose: Secondary | ICD-10-CM

## 2024-03-14 DIAGNOSIS — D649 Anemia, unspecified: Secondary | ICD-10-CM

## 2024-03-14 DIAGNOSIS — Z13220 Encounter for screening for lipoid disorders: Secondary | ICD-10-CM

## 2024-03-14 DIAGNOSIS — R030 Elevated blood-pressure reading, without diagnosis of hypertension: Secondary | ICD-10-CM

## 2024-03-29 ENCOUNTER — Other Ambulatory Visit (HOSPITAL_COMMUNITY)
Admission: RE | Admit: 2024-03-29 | Discharge: 2024-03-29 | Disposition: A | Discharge status: Home / Self Care | Payer: Self-pay | Source: Ambulatory Visit | Attending: Physician Assistant | Admitting: Physician Assistant

## 2024-03-29 ENCOUNTER — Ambulatory Visit: Payer: Self-pay | Admitting: Physician Assistant

## 2024-03-29 ENCOUNTER — Encounter: Payer: Self-pay | Admitting: Physician Assistant

## 2024-03-29 VITALS — BP 161/100 | HR 65 | Temp 97.8°F

## 2024-03-29 DIAGNOSIS — Z1239 Encounter for other screening for malignant neoplasm of breast: Secondary | ICD-10-CM

## 2024-03-29 DIAGNOSIS — Z3009 Encounter for other general counseling and advice on contraception: Secondary | ICD-10-CM

## 2024-03-29 DIAGNOSIS — Z131 Encounter for screening for diabetes mellitus: Secondary | ICD-10-CM | POA: Insufficient documentation

## 2024-03-29 DIAGNOSIS — R7309 Other abnormal glucose: Secondary | ICD-10-CM | POA: Insufficient documentation

## 2024-03-29 DIAGNOSIS — I10 Essential (primary) hypertension: Secondary | ICD-10-CM

## 2024-03-29 DIAGNOSIS — D649 Anemia, unspecified: Secondary | ICD-10-CM | POA: Insufficient documentation

## 2024-03-29 DIAGNOSIS — B354 Tinea corporis: Secondary | ICD-10-CM

## 2024-03-29 DIAGNOSIS — Z13220 Encounter for screening for lipoid disorders: Secondary | ICD-10-CM | POA: Insufficient documentation

## 2024-03-29 DIAGNOSIS — R030 Elevated blood-pressure reading, without diagnosis of hypertension: Secondary | ICD-10-CM | POA: Insufficient documentation

## 2024-03-29 DIAGNOSIS — Z789 Other specified health status: Secondary | ICD-10-CM

## 2024-03-29 LAB — HEMOGLOBIN A1C
Hgb A1c MFr Bld: 5.2 % (ref 4.8–5.6)
Mean Plasma Glucose: 102.54 mg/dL

## 2024-03-29 LAB — LIPID PANEL
Cholesterol: 189 mg/dL (ref 0–200)
HDL: 51 mg/dL (ref >40)
LDL Cholesterol: 118 mg/dL — ABNORMAL HIGH (ref 0–99)
Total CHOL/HDL Ratio: 3.7 ratio
Triglycerides: 99 mg/dL (ref <150)
VLDL: 20 mg/dL (ref 0–40)

## 2024-03-29 LAB — COMPREHENSIVE METABOLIC PANEL WITH GFR
ALT: 14 U/L (ref 0–44)
AST: 18 U/L (ref 15–41)
Albumin: 4.1 g/dL (ref 3.5–5.0)
Alkaline Phosphatase: 69 U/L (ref 38–126)
Anion gap: 7 (ref 5–15)
BUN: 9 mg/dL (ref 6–20)
CO2: 25 mmol/L (ref 22–32)
Calcium: 9 mg/dL (ref 8.9–10.3)
Chloride: 103 mmol/L (ref 98–111)
Creatinine, Ser: 0.49 mg/dL (ref 0.44–1.00)
GFR, Estimated: >60 mL/min (ref >60)
Glucose, Bld: 105 mg/dL — ABNORMAL HIGH (ref 70–99)
Potassium: 4.2 mmol/L (ref 3.5–5.1)
Sodium: 135 mmol/L (ref 135–145)
Total Bilirubin: 0.8 mg/dL (ref 0.0–1.2)
Total Protein: 7.5 g/dL (ref 6.5–8.1)

## 2024-03-29 LAB — CBC
HCT: 35.9 % — ABNORMAL LOW (ref 36.0–46.0)
Hemoglobin: 10.9 g/dL — ABNORMAL LOW (ref 12.0–15.0)
MCH: 23.6 pg — ABNORMAL LOW (ref 26.0–34.0)
MCHC: 30.4 g/dL (ref 30.0–36.0)
MCV: 77.9 fL — ABNORMAL LOW (ref 80.0–100.0)
Platelets: 443 10*3/uL — ABNORMAL HIGH (ref 150–400)
RBC: 4.61 MIL/uL (ref 3.87–5.11)
RDW: 21.6 % — ABNORMAL HIGH (ref 11.5–15.5)
WBC: 3.6 10*3/uL — ABNORMAL LOW (ref 4.0–10.5)
nRBC: 0 % (ref 0.0–0.2)

## 2024-03-29 MED ORDER — NORGESTIMATE-ETH ESTRADIOL 0.25-35 MG-MCG PO TABS: 1.0000 | ORAL_TABLET | Freq: Every day | ORAL | 11 refills | Status: DC

## 2024-03-29 MED ORDER — AMLODIPINE BESYLATE 5 MG PO TABS: 5.0000 mg | ORAL_TABLET | Freq: Every day | ORAL | 1 refills | Status: DC

## 2024-03-29 MED ORDER — CLOTRIMAZOLE-BETAMETHASONE 1-0.05 % EX CREA
1.0000 | TOPICAL_CREAM | Freq: Two times a day (BID) | CUTANEOUS | Status: DC
Start: 2024-03-29 — End: 2024-04-26

## 2024-03-29 NOTE — Progress Notes (Signed)
 BP (!) 161/100   Pulse 65   Temp 97.8 F (36.6 C)   SpO2 99%    Subjective:    Patient ID: Desiree Thornton, female    DOB: 07-03-1981, 43 y.o.   MRN: [id number]  HPI: Desiree Thornton is a 43 y.o. female presenting on 03/29/2024 for No chief complaint on file.   HPI   Pt presented to office 03/10/24 to re-establish care but was sick at the time.  Prior to that, pt was last seen here 03/19/2020.  She had been seen at [facility] in the interim.  Pt has history of HTN.  She never got covid vaccination.  She has never had a mammogram.  She had labs ordered to be done prior to today's appointment but she hasn't gotten them drawn yet.   She is not using contraception.  She was on depo in the past for 3 years.    She says she asked God to not have her get pregnant again.  She says those things make her worse-  she says depo made her have menses for two months straight.    She has been off bp meds for years.  She is feeling well today.     Relevant past medical, surgical, family and social history reviewed and updated as indicated. Interim medical history since our last visit reviewed. Allergies and medications reviewed and updated.  No current outpatient medications on file.    Review of Systems  Per HPI unless specifically indicated above     Objective:    BP (!) 161/100   Pulse 65   Temp 97.8 F (36.6 C)   SpO2 99%   Wt Readings from Last 3 Encounters:  03/10/24 146 lb 4 oz (66.3 kg)  04/20/23 140 lb (63.5 kg)  03/19/20 140 lb 14.4 oz (63.9 kg)    Physical Exam Vitals reviewed.  Constitutional:      General: She is not in acute distress.    Appearance: She is well-developed. She is not toxic-appearing.  HENT:     Head: Normocephalic and atraumatic.  Cardiovascular:     Rate and Rhythm: Normal rate and regular rhythm.     Pulses:          Dorsalis pedis pulses are 2+ on the right side and 2+ on the left side.  Pulmonary:     Effort:  Pulmonary effort is normal.     Breath sounds: Normal breath sounds.  Abdominal:     General: Bowel sounds are normal.     Palpations: Abdomen is soft. There is no mass.     Tenderness: There is no abdominal tenderness.  Musculoskeletal:     Cervical back: Neck supple.     Right lower leg: No edema.     Left lower leg: No edema.     Comments: Some varicosities of lower extremity  Lymphadenopathy:     Cervical: No cervical adenopathy.  Skin:    General: Skin is warm and dry.     Findings: Rash present.  Neurological:     Mental Status: She is alert and oriented to person, place, and time.  Psychiatric:        Behavior: Behavior normal.           Assessment & Plan:   Encounter Diagnoses  Name Primary?   Primary hypertension Yes   Encounter for screening for malignant neoplasm of breast, unspecified screening modality    Tinea corporis    Counseling for birth control,  oral contraceptives    Not proficient in [place] language      -pt is referred for screening mammogram -pt reminded to get fasting Labs drawn -record Request for most recent PAP from [facility]  -will Restart pt on bp med.  She is given rx for amlodipine 5mg  -pt is given sample Lotrisone for her rash -discussed contraceptive options with pt and she prefers OCP.  Pt ws counseled on how to start the medication and how to take it properly.  Additionally she was given reading material.  Rx Sprintec sent to pharmacy -pt to follow up 1 month to recheck bp.  She is to contact office sooner prn

## 2024-03-29 NOTE — Patient Instructions (Signed)
 Cmo usar pldoras anticonceptivas (anticonceptivos orales) How to Use Birth Control Pills (Oral Contraceptives) Los anticonceptivos orales, o pldoras anticonceptivas, son medicamentos que Midwife. Funcionan de las siguientes maneras: Evitando que los ovarios liberen vulos. Engrosando la mucosidad de la parte inferior del tero, que se llama cuello uterino. Esto evita que los espermatozoides lleguen al tero. Haciendo ms delgado el revestimiento del tero. Esto evita que el vulo fecundado se implante en el revestimiento. Hable con el mdico acerca de los posibles efectos secundarios. Puede llevar de 2 a 3 meses que el organismo se adapte a los [employer] Cules son los riesgos? Las pldoras anticonceptivas pueden, en algunos casos, causar efectos secundarios, como los siguientes: Dolor de cabeza. Depresin. Dificultad para dormir. Nuseas y vmitos, distensin o retencin de lquidos. Dolor a la palpacin en las mamas. Sangrado o manchado irregulares durante los primeros meses. Aumento de la presin arterial. Las pldoras anticonceptivas con estrgeno y progestinas pueden aumentar ligeramente el riesgo de: Cogulos de [place]. Infarto de miocardio. Accidente cerebrovascular. Cmo tomar pldoras anticonceptivas Siga las instrucciones del mdico acerca de cmo tomar el primer ciclo de pldoras anticonceptivas. Hay dos tipos de pldoras: Pldoras anticonceptivas combinadas. Estas contienen tanto estrgeno como progestina. En el caso de las pldoras combinadas, puede comenzar a tomar la pldora: El da 1 del perodo menstrual. El primer domingo despus del comienzo del perodo menstrual, o Medical laboratory scientific officer en que adquiere las pldoras. En cualquier momento de su ciclo. Si comienza a tomar las [employer] transcurso de los [address]  [address] posteriores al comienzo del perodo, no Pension scheme manager usar ningn mtodo anticonceptivo de [place], [place] condones. Si comienza en  cualquier otro momento del ciclo menstrual, necesitar usar un mtodo anticonceptivo de respaldo. Pldoras de progestina sola. Tambin se las llama minipldoras. Estos solo contienen progestina. En el caso de las pldoras de progestina sola: Idealmente, puede empezar a Environmental manager de su perodo menstrual, pero tambin puede iniciar cualquier otro da. Estas pldoras la protegern del embarazo despus de tomarlas durante 2 das (48 horas). Puede dejar de [employer] un mtodo anticonceptivo de respaldo despus de ese tiempo. Debe tomar esta pldora todos los das a la misma hora. Incluso tomarla 3 horas tarde puede aumentar el riesgo de embarazo. No importa cuando comience a tomar las pldoras, siempre empiece un nuevo envase el mismo da de la [place]. Tenga un envase extra de pldoras y use un mtodo anticonceptivo adicional para el caso en que se olvide de tomar algunas pldoras o pierda la caja de pldoras. Dosis omitidas Siga las instrucciones del mdico con respecto a las dosis omitidas. Tambin puede encontrar qu debe hacer en casos de omitir la dosis en la informacin para el paciente que viene con la caja de pldoras. En general, para las pldoras combinadas: Si olvid de tomar la pldora de 1 da, tmela tan pronto como pueda. Esto puede significar tomar 2 pastillas el mismo da y a la misma hora. Tome la pldora del da siguiente a la hora habitual. Si olvida tomar la pldora durante 2 das seguidos, tome 2 pldoras el da que lo recuerde y 2 pldoras Medical laboratory scientific officer siguiente. Debe utilizar un mtodo anticonceptivo de respaldo durante 7 das despus de haber vuelto al cronograma habitual. Si olvida tomar la pldora durante 3 das seguidos, llame al mdico para que le indique cundo debe volver a tomarlas. No tome las pldoras omitidas. Ser necesario que use un mtodo anticonceptivo de respaldo durante [address]  vez que reinicie la toma de las pldoras. Si utiliza un paquete que  contiene pldoras inactivas y omite 1 o ms de las pldoras inactivas, no es necesario que tome las dosis omitidas. Omtalas y empiece el nuevo paquete el da en que lo hara normalmente. En el caso de las pldoras de progestina sola: Si la dosis se retrasa 3 horas o ms, o si omite 1 o ms dosis, tome 1 pldora olvidada tan pronto como pueda. Si omite 1 o ms dosis, debe utilizar un mtodo anticonceptivo de [place]. Algunas marcas de pldoras de progestina sola recomiendan usar un mtodo anticonceptivo de respaldo durante 48 horas despus de omitir una dosis o tomarla con Brewing technologist, mientras que otras recomiendan [address]  [address]. Si no est segura de Wellsite geologist, llame al mdico o consulte la informacin que vino con las pldoras. Siga estas instrucciones en su casa: No fume, vapee ni consuma nicotina o tabaco. Use siempre un condn para protegerse de las infecciones de transmisin sexual (ITS). Las pldoras anticonceptivas no protegen contra las ITS. Use un calendario para [employer] de su perodo menstrual. Lea la informacin y consejos que vienen con las pldoras. Si tiene alguna pregunta, hable con su mdico. Comunquese con un mdico si: Tiene secrecin o un sangrado de la vagina que no es normal. Tiene una erupcin cutnea, se le cae el cabello o tiene acn despus de tomar las pldoras. No tiene el perodo menstrual. Segn el tipo de pldoras que tome, esto podra ser una seal de embarazo. Necesita tratamiento por cambios en su estado de nimo o por depresin. Se siente mareada al tomar las pldoras. Est embarazada o piensa que podra estarlo. Siente ganas de vomitar o vomita. Tiene diarrea, dificultad para defecar (estreimiento) y dolor o clicos en el vientre. No est segura de qu hacer despus de omitir pldoras. Solicite ayuda de inmediato si: Midwife. Le falta el aire. Tiene una cefalea muy intensa. Presenta adormecimiento o tiene dificultad para hablar. Presenta  problemas de visin. Presenta dolor, enrojecimiento o hinchazn en las piernas. Presenta debilidad o adormecimiento en los brazos o en las piernas. Presenta dolor en la parte superior derecha del vientre, prdida del apetito, siente ganas vomitar y tiene heces de color claro. Hace orina de color amarillo oscuro o marrn, tiene la piel o los ojos amarillentos, o siente una debilidad o un cansancio inusuales. Presenta migraas o dolores de cabeza nuevos o que [place]. Estos sntomas pueden Customer service manager. Llame al 911 de inmediato. No espere a ver si los sntomas desaparecen. No conduzca por sus propios medios [employer]. Esta informacin no tiene Theme park manager el consejo del mdico. Asegrese de hacerle al mdico cualquier pregunta que tenga. Document Revised: 07/01/2023 Document Reviewed: 07/01/2023 Elsevier Patient Education  2024 [employer].

## 2024-04-19 ENCOUNTER — Other Ambulatory Visit: Payer: Self-pay | Admitting: Physician Assistant

## 2024-04-19 DIAGNOSIS — Z1231 Encounter for screening mammogram for malignant neoplasm of breast: Secondary | ICD-10-CM

## 2024-04-26 ENCOUNTER — Encounter: Payer: Self-pay | Admitting: Physician Assistant

## 2024-04-26 ENCOUNTER — Ambulatory Visit: Payer: Self-pay | Admitting: Physician Assistant

## 2024-04-26 VITALS — BP 148/110 | HR 66 | Temp 98.1°F | Ht 60.25 in | Wt 151.0 lb

## 2024-04-26 DIAGNOSIS — Z789 Other specified health status: Secondary | ICD-10-CM

## 2024-04-26 DIAGNOSIS — D649 Anemia, unspecified: Secondary | ICD-10-CM

## 2024-04-26 DIAGNOSIS — I10 Essential (primary) hypertension: Secondary | ICD-10-CM | POA: Insufficient documentation

## 2024-04-26 DIAGNOSIS — B354 Tinea corporis: Secondary | ICD-10-CM

## 2024-04-26 MED ORDER — ALBUTEROL SULFATE HFA 108 (90 BASE) MCG/ACT IN AERS: 2.0000 | INHALATION_SPRAY | Freq: Four times a day (QID) | RESPIRATORY_TRACT | 1 refills | Status: DC | PRN

## 2024-04-26 MED ORDER — CLOTRIMAZOLE-BETAMETHASONE 1-0.05 % EX CREA: 1.0000 | TOPICAL_CREAM | Freq: Two times a day (BID) | CUTANEOUS | 1 refills | Status: DC

## 2024-04-26 MED ORDER — IRON (FERROUS SULFATE) 325 (65 FE) MG PO TABS
325.0000 mg | ORAL_TABLET | Freq: Every day | ORAL | Status: DC
Start: 2024-04-26 — End: 2024-09-06

## 2024-04-26 NOTE — Progress Notes (Signed)
 BP (!) 148/110   Pulse 66   Temp 98.1 F (36.7 C)   Ht 5' 0.25" (1.53 m)   Wt 151 lb (68.5 kg)   SpO2 99%   BMI 29.25 kg/m    Subjective:    Patient ID: Desiree Thornton, female    DOB: October 16, 1981, 43 y.o.   MRN: [id number]  HPI: Desiree Thornton is a 43 y.o. female presenting on 04/26/2024 for Hypertension (Pt states she forgets to take her amlodipine . Pt states she might take it 2-3 days out of the week. ), Gynecologic Exam (Pt states she is currently on day 4 of her menses.), Rash (Pt states she lost her lotrisone  cream, but when she was using it she felt it was helping), and Wheezing (Pt states she wheezes during the night and has some trouble breathing. Pt states she use to have an inhaler for her asthma.)   HPI   Chief Complaint  Patient presents with   Hypertension    Pt states she forgets to take her amlodipine . Pt states she might take it 2-3 days out of the week.    Gynecologic Exam    Pt states she is currently on day 4 of her menses.   Rash    Pt states she lost her lotrisone  cream, but when she was using it she felt it was helping   Wheezing    Pt states she wheezes during the night and has some trouble breathing. Pt states she use to have an inhaler for her asthma.    She used cream for her rash only 3 times before she lost it.  Pt says she and her husband decided not to take the OCP.   Relevant past medical, surgical, family and social history reviewed and updated as indicated. Interim medical history since our last visit reviewed. Allergies and medications reviewed and updated.   Current Outpatient Medications:    amLODipine  (NORVASC ) 5 MG tablet, Take 1 tablet (5 mg total) by mouth daily. Tome una tableta por boca diaria, Disp: 30 tablet, Rfl: 1   clotrimazole -betamethasone  (LOTRISONE ) cream, Apply 1 Application topically 2 (two) times daily. (Patient not taking: Reported on 04/26/2024), Disp: , Rfl:    norgestimate -ethinyl estradiol   (SPRINTEC 28) 0.25-35 MG-MCG tablet, Take 1 tablet by mouth daily. Tome una tableta por boca diaria (Patient not taking: Reported on 04/26/2024), Disp: 28 tablet, Rfl: 11   Review of Systems  Per HPI unless specifically indicated above     Objective:     BP (!) 148/110   Pulse 66   Temp 98.1 F (36.7 C)   Ht 5' 0.25" (1.53 m)   Wt 151 lb (68.5 kg)   SpO2 99%   BMI 29.25 kg/m   Wt Readings from Last 3 Encounters:  04/26/24 151 lb (68.5 kg)  03/10/24 146 lb 4 oz (66.3 kg)  04/20/23 140 lb (63.5 kg)    Physical Exam Vitals reviewed.  Constitutional:      General: She is not in acute distress.    Appearance: She is well-developed. She is not toxic-appearing.  HENT:     Head: Normocephalic and atraumatic.  Cardiovascular:     Rate and Rhythm: Normal rate and regular rhythm.  Pulmonary:     Effort: Pulmonary effort is normal. No respiratory distress.     Breath sounds: No stridor. Wheezing present. No rhonchi.  Abdominal:     General: Bowel sounds are normal.     Palpations: Abdomen is soft. There is no mass.  Tenderness: There is no abdominal tenderness.  Musculoskeletal:     Cervical back: Neck supple.     Right lower leg: No edema.  Lymphadenopathy:     Cervical: No cervical adenopathy.  Skin:    General: Skin is warm and dry.  Neurological:     Mental Status: She is alert and oriented to person, place, and time.  Psychiatric:        Behavior: Behavior normal.     Results for orders placed or performed during the hospital encounter of 03/29/24  Hemoglobin A1c   Collection Time: 03/29/24  9:54 AM  Result Value Ref Range   Hgb A1c MFr Bld 5.2 4.8 - 5.6 %   Mean Plasma Glucose 102.54 mg/dL  Comprehensive metabolic panel with GFR   Collection Time: 03/29/24  9:54 AM  Result Value Ref Range   Sodium 135 135 - 145 mmol/L   Potassium 4.2 3.5 - 5.1 mmol/L   Chloride 103 98 - 111 mmol/L   CO2 25 22 - 32 mmol/L   Glucose, Bld 105 (H) 70 - 99 mg/dL   BUN 9 6 -  20 mg/dL   Creatinine, Ser 1.30 0.44 - 1.00 mg/dL   Calcium 9.0 8.9 - 86.5 mg/dL   Total Protein 7.5 6.5 - 8.1 g/dL   Albumin 4.1 3.5 - 5.0 g/dL   AST 18 15 - 41 U/L   ALT 14 0 - 44 U/L   Alkaline Phosphatase 69 38 - 126 U/L   Total Bilirubin 0.8 0.0 - 1.2 mg/dL   GFR, Estimated >78 >46 mL/min   Anion gap 7 5 - 15  Lipid panel   Collection Time: 03/29/24  9:54 AM  Result Value Ref Range   Cholesterol 189 0 - 200 mg/dL   Triglycerides 99 <962 mg/dL   HDL 51 >95 mg/dL   Total CHOL/HDL Ratio 3.7 RATIO   VLDL 20 0 - 40 mg/dL   LDL Cholesterol 284 (H) 0 - 99 mg/dL  CBC   Collection Time: 03/29/24  9:54 AM  Result Value Ref Range   WBC 3.6 (L) 4.0 - 10.5 K/uL   RBC 4.61 3.87 - 5.11 MIL/uL   Hemoglobin 10.9 (L) 12.0 - 15.0 g/dL   HCT 13.2 (L) 44.0 - 10.2 %   MCV 77.9 (L) 80.0 - 100.0 fL   MCH 23.6 (L) 26.0 - 34.0 pg   MCHC 30.4 30.0 - 36.0 g/dL   RDW 72.5 (H) 36.6 - 44.0 %   Platelets 443 (H) 150 - 400 K/uL   nRBC 0.0 0.0 - 0.2 %      Assessment & Plan:    Encounter Diagnoses  Name Primary?   Primary hypertension Yes   Tinea corporis    Anemia, unspecified type    Not proficient in English language      Anemia -reviewed labs with pt -discussed menses heavy.  OCP would help -OTC iron  Htn -pt encouraged to take med daily.  Discussed strategies to help like setting an alarm on her phone -she was given video to watch as well  Wheezing -pt states history of intermittent asthma iin the spring with allergies -rx albuterol  mdi  Ringworm -she was given rx for lotrisone    She will RTO three weeks for PAP and recheck bp.  She is to contact office sooner prn

## 2024-04-26 NOTE — Patient Instructions (Signed)
 Controle su hipertensin La persona con hipertensin podr aprender qu es la presin arterial, qu significan los nmeros y qu puede hacer para Pharmacologist la presin arterial en un valor normal.  To view the content, go to this web address: [email or web address]  This video will expire on: 11/11/2025. If you need access to this video following this date, please reach out to the healthcare provider who assigned it to you. This information is not intended to replace advice given to you by your health care provider. Make sure you discuss any questions you have with your health care provider. Elsevier Patient Education  2024 [employer].

## 2024-05-02 ENCOUNTER — Ambulatory Visit (HOSPITAL_COMMUNITY)
Admission: RE | Admit: 2024-05-02 | Discharge: 2024-05-02 | Disposition: A | Discharge status: Home / Self Care | Payer: Self-pay | Source: Ambulatory Visit | Attending: Physician Assistant | Admitting: Physician Assistant

## 2024-05-02 DIAGNOSIS — Z1231 Encounter for screening mammogram for malignant neoplasm of breast: Secondary | ICD-10-CM | POA: Insufficient documentation

## 2024-05-17 ENCOUNTER — Ambulatory Visit: Payer: Self-pay | Admitting: Physician Assistant

## 2024-05-25 ENCOUNTER — Ambulatory Visit: Payer: Self-pay | Admitting: Physician Assistant

## 2024-05-25 ENCOUNTER — Encounter: Payer: Self-pay | Admitting: Physician Assistant

## 2024-05-25 VITALS — BP 128/93 | HR 77 | Temp 98.0°F | Ht 60.25 in | Wt 150.0 lb

## 2024-05-25 DIAGNOSIS — I10 Essential (primary) hypertension: Secondary | ICD-10-CM

## 2024-05-25 DIAGNOSIS — Z789 Other specified health status: Secondary | ICD-10-CM

## 2024-05-25 MED ORDER — AMLODIPINE BESYLATE 5 MG PO TABS: 5.0000 mg | ORAL_TABLET | Freq: Every day | ORAL | 1 refills | Status: DC

## 2024-05-25 NOTE — Progress Notes (Signed)
 BP (!) 128/93   Pulse 77   Temp 98 F (36.7 C)   Ht 5' 0.25 (1.53 m)   Wt 150 lb (68 kg)   LMP 05/24/2024   SpO2 97%   BMI 29.05 kg/m    Subjective:    Patient ID: Desiree Thornton, female    DOB: May 06, 1981, 43 y.o.   MRN: [id number]  HPI: Desiree Thornton is a 43 y.o. female presenting on 05/25/2024 for Gynecologic Exam (Pt states her period just ended yesterday, but she is not bleeding today. Pt is wondering i) and Hypertension   HPI   Chief Complaint  Patient presents with   Gynecologic Exam    Pt states her period just ended yesterday, but she is not bleeding today. Pt is wondering if    Hypertension      Relevant past medical, surgical, family and social history reviewed and updated as indicated. Interim medical history since our last visit reviewed. Allergies and medications reviewed and updated.   Current Outpatient Medications:    amLODipine  (NORVASC ) 5 MG tablet, Take 1 tablet (5 mg total) by mouth daily. Tome una tableta por boca diaria, Disp: 30 tablet, Rfl: 1   clotrimazole -betamethasone  (LOTRISONE ) cream, Apply 1 Application topically 2 (two) times daily., Disp: 15 g, Rfl: 1   Iron , Ferrous Sulfate , 325 (65 Fe) MG TABS, Take 325 mg by mouth daily., Disp: , Rfl:    albuterol  (VENTOLIN  HFA) 108 (90 Base) MCG/ACT inhaler, Inhale 2 puffs into the lungs every 6 (six) hours as needed for wheezing or shortness of breath. (Patient not taking: Reported on 05/25/2024), Disp: 1 each, Rfl: 1   norgestimate -ethinyl estradiol  (SPRINTEC 28) 0.25-35 MG-MCG tablet, Take 1 tablet by mouth daily. Tome una tableta por boca diaria (Patient not taking: Reported on 05/25/2024), Disp: 28 tablet, Rfl: 11    Review of Systems  Per HPI unless specifically indicated above     Objective:    BP (!) 128/93   Pulse 77   Temp 98 F (36.7 C)   Ht 5' 0.25 (1.53 m)   Wt 150 lb (68 kg)   LMP 05/24/2024   SpO2 97%   BMI 29.05 kg/m   Wt Readings from Last 3  Encounters:  05/25/24 150 lb (68 kg)  04/26/24 151 lb (68.5 kg)  03/10/24 146 lb 4 oz (66.3 kg)    Physical Exam Vitals reviewed.  Constitutional:      General: She is not in acute distress.    Appearance: She is well-developed. She is not toxic-appearing.  HENT:     Head: Normocephalic and atraumatic.   Cardiovascular:     Rate and Rhythm: Normal rate and regular rhythm.  Pulmonary:     Effort: Pulmonary effort is normal.     Breath sounds: Normal breath sounds.   Musculoskeletal:     Cervical back: Neck supple.     Right lower leg: No edema.     Left lower leg: No edema.  Lymphadenopathy:     Cervical: No cervical adenopathy.   Skin:    General: Skin is warm and dry.   Neurological:     Mental Status: She is alert and oriented to person, place, and time.   Psychiatric:        Behavior: Behavior normal.           Assessment & Plan:    Encounter Diagnoses  Name Primary?   Primary hypertension Yes   Not proficient in [place] language  Htn -Continue amlodipine   HCM -counseled on contraception as she declined to take OCP and doesn't want other rx.  Encouraged condoms -defer PAP since she is likely still bleeding enough to not get good sample  Uninsured -pt counseled to return to [facility] to complete her enrollment  Pt to RTO in 6 weeks for PAP.  She is to contact office sooner prn

## 2024-07-05 ENCOUNTER — Ambulatory Visit: Payer: Self-pay | Admitting: Physician Assistant

## 2024-07-05 ENCOUNTER — Encounter: Payer: Self-pay | Admitting: Physician Assistant

## 2024-07-05 VITALS — BP 136/84 | HR 89 | Temp 98.1°F | Ht 60.25 in | Wt 151.0 lb

## 2024-07-05 DIAGNOSIS — Z789 Other specified health status: Secondary | ICD-10-CM

## 2024-07-05 NOTE — Progress Notes (Signed)
   BP 136/84   Pulse 89   Temp 98.1 F (36.7 C)   Ht 5' 0.25 (1.53 m)   Wt 151 lb (68.5 kg)   LMP 05/25/2024 (Approximate)   SpO2 99%   BMI 29.25 kg/m    Subjective:    Patient ID: Desiree Thornton, female    DOB: March 10, 1981, 43 y.o.   MRN: [id number]  HPI: Shemaiah Round is a 43 y.o. female presenting on 07/05/2024 for Gynecologic Exam   HPI  Discussed with pt that today is her fifth appointment with [facility] and she has still not completed her enrollment with [facility].   Staff at [facility] were contacted and they said they have discussed with pt the documents that she needs to bring in.  Pt has been reminded when she is here at [facility] that she needs to get this done but she has not.   Relevant past medical, surgical, family and social history reviewed and updated as indicated. Interim medical history since our last visit reviewed. Allergies and medications reviewed and updated.   Current Outpatient Medications:    amLODipine  (NORVASC ) 5 MG tablet, Take 1 tablet (5 mg total) by mouth daily. Tome una tableta por boca diaria, Disp: 30 tablet, Rfl: 1   Iron , Ferrous Sulfate , 325 (65 Fe) MG TABS, Take 325 mg by mouth daily., Disp: , Rfl:    albuterol  (VENTOLIN  HFA) 108 (90 Base) MCG/ACT inhaler, Inhale 2 puffs into the lungs every 6 (six) hours as needed for wheezing or shortness of breath. (Patient not taking: Reported on 07/05/2024), Disp: 1 each, Rfl: 1   clotrimazole -betamethasone  (LOTRISONE ) cream, Apply 1 Application topically 2 (two) times daily. (Patient not taking: Reported on 07/05/2024), Disp: 15 g, Rfl: 1    Review of Systems  Per HPI unless specifically indicated above     Objective:    BP 136/84   Pulse 89   Temp 98.1 F (36.7 C)   Ht 5' 0.25 (1.53 m)   Wt 151 lb (68.5 kg)   LMP 05/25/2024 (Approximate)   SpO2 99%   BMI 29.25 kg/m   Wt Readings from Last 3 Encounters:  07/05/24 151 lb (68.5 kg)  05/25/24 150 lb (68 kg)  04/26/24 151 lb (68.5  kg)    Physical Exam Constitutional:      General: She is not in acute distress.    Appearance: She is not toxic-appearing.  HENT:     Head: Normocephalic and atraumatic.  Pulmonary:     Effort: Pulmonary effort is normal. No respiratory distress.  Neurological:     Mental Status: She is alert and oriented to person, place, and time.  Psychiatric:        Behavior: Behavior normal.           Assessment & Plan:    Encounter Diagnosis  Name Primary?   Not proficient in English language Yes     -discussed with pt that she needs to get her enrollment done with [facility].  She states understanding

## 2024-07-06 ENCOUNTER — Ambulatory Visit: Payer: Self-pay | Admitting: Physician Assistant

## 2024-07-26 ENCOUNTER — Ambulatory Visit: Payer: Self-pay | Admitting: Physician Assistant

## 2024-08-10 ENCOUNTER — Encounter: Payer: Self-pay | Admitting: Physician Assistant

## 2024-08-10 ENCOUNTER — Ambulatory Visit: Payer: Self-pay | Admitting: Physician Assistant

## 2024-08-10 VITALS — BP 150/96 | HR 99 | Temp 98.2°F | Ht 60.25 in | Wt 149.0 lb

## 2024-08-10 DIAGNOSIS — D649 Anemia, unspecified: Secondary | ICD-10-CM

## 2024-08-10 DIAGNOSIS — I10 Essential (primary) hypertension: Secondary | ICD-10-CM

## 2024-08-10 DIAGNOSIS — Z789 Other specified health status: Secondary | ICD-10-CM

## 2024-08-10 MED ORDER — AMLODIPINE BESYLATE 5 MG PO TABS: 5.0000 mg | ORAL_TABLET | Freq: Every day | ORAL | 0 refills | Status: DC

## 2024-08-10 NOTE — Progress Notes (Signed)
 BP (!) 150/96   Pulse 99   Temp 98.2 F (36.8 C)   Ht 5' 0.25 (1.53 m)   Wt 149 lb (67.6 kg)   SpO2 96%   BMI 28.86 kg/m    Subjective:    Patient ID: Desiree Thornton, female    DOB: 04/04/1981, 43 y.o.   MRN: [id number]  HPI: Desiree Thornton is a 43 y.o. female presenting on 08/10/2024 for Menstrual Problem (Pt states she is bleeding a little now.  /Pt states her menses ended 2 weeks ago where it lasted 6 days with heavy bleeding. Before this mensus it was 2 months prior. Pt states she started light bleeding 2 days ago with abd cramping. These most recent cycles have been irregular, prior pt had a cycle every month lasting 3-4 days.) and Hypertension (Pt has been out of her medicine for about 3 weeks. Pt states she hasn't been unable to pick it up due to work and other reasons. Pt states she has been experiencing chest pains since last week which last a few seconds. Pt states she has felt the cp when she is walking and also sitting. cp is about 3 days a week about 2-3 times a day. )   HPI  Relevant past medical, surgical, family and social history reviewed and updated as indicated. Interim medical history since our last visit reviewed. Allergies and medications reviewed and updated.   Current Outpatient Medications:    clotrimazole -betamethasone  (LOTRISONE ) cream, Apply 1 Application topically 2 (two) times daily., Disp: 15 g, Rfl: 1   Iron , Ferrous Sulfate , 325 (65 Fe) MG TABS, Take 325 mg by mouth daily., Disp: , Rfl:    albuterol  (VENTOLIN  HFA) 108 (90 Base) MCG/ACT inhaler, Inhale 2 puffs into the lungs every 6 (six) hours as needed for wheezing or shortness of breath. (Patient not taking: Reported on 08/10/2024), Disp: 1 each, Rfl: 1   amLODipine  (NORVASC ) 5 MG tablet, Take 1 tablet (5 mg total) by mouth daily. Tome una tableta por boca diaria (Patient not taking: Reported on 08/10/2024), Disp: 30 tablet, Rfl: 1   Review of Systems  Per HPI unless  specifically indicated above     Objective:    BP (!) 150/96   Pulse 99   Temp 98.2 F (36.8 C)   Ht 5' 0.25 (1.53 m)   Wt 149 lb (67.6 kg)   SpO2 96%   BMI 28.86 kg/m   Wt Readings from Last 3 Encounters:  08/10/24 149 lb (67.6 kg)  07/05/24 151 lb (68.5 kg)  05/25/24 150 lb (68 kg)    Physical Exam Vitals reviewed.  Constitutional:      General: She is not in acute distress.    Appearance: She is well-developed. She is not toxic-appearing.  HENT:     Head: Normocephalic and atraumatic.  Cardiovascular:     Rate and Rhythm: Normal rate and regular rhythm.  Pulmonary:     Effort: Pulmonary effort is normal.     Breath sounds: Normal breath sounds.  Abdominal:     General: Bowel sounds are normal.     Palpations: Abdomen is soft. There is no mass.     Tenderness: There is no abdominal tenderness.  Musculoskeletal:     Cervical back: Neck supple.     Right lower leg: No edema.     Left lower leg: No edema.  Lymphadenopathy:     Cervical: No cervical adenopathy.  Skin:    General: Skin is warm and  dry.  Neurological:     Mental Status: She is alert and oriented to person, place, and time.  Psychiatric:        Behavior: Behavior normal.          Assessment & Plan:   Encounter Diagnoses  Name Primary?   Primary hypertension Yes   Not proficient in English language       Pt counseled to take bp medication every day as prescribed Pt will follow up to recheck bp one month.  Will update pap at that time.  Pt to contact office sooner prn

## 2024-09-06 ENCOUNTER — Ambulatory Visit: Payer: Self-pay | Admitting: Physician Assistant

## 2024-09-06 ENCOUNTER — Encounter: Payer: Self-pay | Admitting: Physician Assistant

## 2024-09-06 ENCOUNTER — Other Ambulatory Visit (HOSPITAL_COMMUNITY)
Admission: RE | Admit: 2024-09-06 | Discharge: 2024-09-06 | Disposition: A | Discharge status: Home / Self Care | Payer: Self-pay | Source: Ambulatory Visit | Attending: Physician Assistant | Admitting: Physician Assistant

## 2024-09-06 VITALS — BP 134/92 | HR 93 | Temp 98.7°F | Ht 60.25 in | Wt 148.0 lb

## 2024-09-06 DIAGNOSIS — Z01419 Encounter for gynecological examination (general) (routine) without abnormal findings: Secondary | ICD-10-CM | POA: Insufficient documentation

## 2024-09-06 DIAGNOSIS — Z789 Other specified health status: Secondary | ICD-10-CM

## 2024-09-06 DIAGNOSIS — D649 Anemia, unspecified: Secondary | ICD-10-CM | POA: Insufficient documentation

## 2024-09-06 DIAGNOSIS — I10 Essential (primary) hypertension: Secondary | ICD-10-CM | POA: Insufficient documentation

## 2024-09-06 LAB — CBC
HCT: 33.1 % — ABNORMAL LOW (ref 36.0–46.0)
Hemoglobin: 10.3 g/dL — ABNORMAL LOW (ref 12.0–15.0)
MCH: 25.4 pg — ABNORMAL LOW (ref 26.0–34.0)
MCHC: 31.1 g/dL (ref 30.0–36.0)
MCV: 81.7 fL (ref 80.0–100.0)
Platelets: 463 K/uL — ABNORMAL HIGH (ref 150–400)
RBC: 4.05 MIL/uL (ref 3.87–5.11)
RDW: 14.5 % (ref 11.5–15.5)
WBC: 4.5 K/uL (ref 4.0–10.5)
nRBC: 0 % (ref 0.0–0.2)

## 2024-09-06 LAB — BASIC METABOLIC PANEL WITH GFR
Anion gap: 12 (ref 5–15)
BUN: 10 mg/dL (ref 6–20)
CO2: 24 mmol/L (ref 22–32)
Calcium: 8.9 mg/dL (ref 8.9–10.3)
Chloride: 104 mmol/L (ref 98–111)
Creatinine, Ser: 0.59 mg/dL (ref 0.44–1.00)
GFR, Estimated: >60 mL/min (ref >60)
Glucose, Bld: 100 mg/dL — ABNORMAL HIGH (ref 70–99)
Potassium: 5.2 mmol/L — ABNORMAL HIGH (ref 3.5–5.1)
Sodium: 139 mmol/L (ref 135–145)

## 2024-09-06 MED ORDER — IRON (FERROUS SULFATE) 325 (65 FE) MG PO TABS: 325.0000 mg | ORAL_TABLET | Freq: Every day | ORAL | 1 refills | Status: AC

## 2024-09-06 MED ORDER — AMLODIPINE BESYLATE 5 MG PO TABS: 5.0000 mg | ORAL_TABLET | Freq: Every day | ORAL | 1 refills | Status: DC

## 2024-09-06 NOTE — Patient Instructions (Signed)
 Protrusin o cada de rganos plvicos (prolapso de rganos plvicos): qu hay que saber Pelvic Organs That Bulge or Drop (Pelvic Organ Prolapse): What to Know  El prolapso de rganos plvicos se produce cuando los rganos de la pelvis se estiran, protruyen o caen en una posicin que no es normal. Se produce cuando los msculos y tejidos que sostienen los rganos plvicos se debilitan o se estiran. Hay varios tipos de prolapso de rganos plvicos. Estos son: Prolapso de la vagina. Prolapso del tero. Prolapso de la vejiga. Prolapso del recto (rectocele). Prolapso intestinal (enterocele). Cuando rganos distintos de la vagina estn involucrados, a menudo protruyen hacia la vagina o sobresalen de ella. La gravedad del prolapso depende de cunto sobresalgan los rganos. Cules son las causas? Embarazo, trabajo de parto y parto. Lesin o ciruga previa del suelo plvico. Menopausia. Se trata de una etapa de la vida en la que la mujer deja de [place]. Enfermedades genticas que [employer] y los tejidos. Tener obesidad. Problemas crnicos para defecar (estreimiento crnico). Histerectoma. Es cuando se extrae el tero. Tabaquismo, enfermedad pulmonar y tos crnica. Levantar peso excesivo. Ejercicios pesados que [employer] y las articulaciones. Cules son los signos o sntomas? Perder un poco de orina al toser, Engineering geologist, hacer esfuerzos o Company secretary. A esto se le llama incontinencia. Puede empeorar justo despus de dar a Patent examiner. Puede que mejore con [place]. Sensacin de presin en la pelvis o la vagina. Puede sentir ms presin al toser o al defecar. Un bulto que sobresale de la abertura de la vagina o en la vagina misma. Problemas para Automotive engineer. Dolor en la parte inferior de la espalda. Dolor, Associate Professor o prdidas de orina durante [address]. Tener ms de un tipo de infeccin de vejiga, tambin llamada infeccin de las vas [place]. Algunos pacientes no  presentan sntomas. Cmo se diagnostica? Un prolapso puede diagnosticarse a partir de un examen de la vagina o de las nalgas. Durante el examen, pueden pedirle que tosa y que haga un esfuerzo mientras est acostada, sentada y de pie.  Tambin pueden realizarse pruebas de la funcin vesical. Cmo se trata? El tratamiento del prolapso de rganos plvicos depende de los sntomas. El tratamiento puede incluir: Cambios en el estilo de vida, como beber mucho lquido y comer alimentos ricos en [place]. Esto hace que sea ms fcil defecar. Orinar a determinadas horas. Esto se llama entrenamiento de la vejiga. Puede ser til si tiene prdidas de comoros. Estrgeno. Puede ser til para fortalecer los msculos del suelo plvico. Ejercicios. Los ejercicios de Nature conservation officer y tensar los msculos del suelo plvico. El yoga y el pilates pueden fortalecer los msculos del vientre. Biorretroalimentacin. Se trata de hacer fisioterapia y tensar los msculos del suelo plvico con un determinado dispositivo. Estimuladores del suelo plvico. Estos dispositivos fortalecen los msculos del suelo plvico si no puede hacer los ejercicios de Kegel. Pesario Se trata de un dispositivo blando y flexible para sostener las paredes de la vagina. Mantiene los rganos plvicos en su sitio. Ciruga. Esto puede ser necesario para tratar un prolapso muy grave. Siga las siguientes instrucciones en el hogar: Actividad Pierda peso segn lo indicado. Evite levantar objetos pesados y hacer esfuerzos cuando se ejercite o trabaje. No contenga la respiracin cuando haga ejercicio o levante peso. Limite el resto de sus actividades segn lo indicado. Haga los ejercicios de Kegel segn lo indicado. Instrucciones generales [employer] segn lo indicado. Utilice una compresa o paales para adultos si tiene prdidas  de comoros. Si tiene un pesario, cudelo segn lo indicado. Comunquese con un mdico si: Tiene sntomas que  se interponen en sus actividades diarias o en su vida sexual. Ladora sangra la vagina y fuera del perodo. Tiene fiebre, flujo vaginal maloliente u otros signos de infeccin. Siente dolor o sangra al [employer]. Tiene dificultad para defecar. El pesario se Quarry manager. Tiene otro tipo de dolor bajo en el vientre. Solicite ayuda de inmediato si: No puede orinar. Esta informacin no tiene Theme park manager el consejo del mdico. Asegrese de hacerle al mdico cualquier pregunta que tenga. Document Revised: 12/09/2023 Document Reviewed: 12/09/2023 Elsevier Patient Education  2025 [employer].  ------------------------------------------------------------------------  [employer] de Kegel Kegel Exercises  Los ejercicios de Kegel pueden ayudar a fortalecer los msculos del suelo plvico. El suelo plvico est formado por un grupo de msculos que sostienen el recto, el intestino delgado y la vejiga. En las mujeres, los msculos del suelo plvico tambin sostienen el tero. Estos msculos ayudan a controlar el flujo de comoros y materia fecal (heces). Los ejercicios de Kegel son simples e indoloros. No requieren ningn equipo. El mdico puede sugerir ejercicios de Kegel para: Mejorar el control de la vejiga y los intestinos. Mejorar la respuesta sexual. Mejorar los [employer] dbiles del suelo plvico despus de una ciruga para extirpar el tero (histerectoma) o de un embarazo, en las mujeres. Mejorar los msculos del suelo plvico dbiles despus de la extirpacin o ciruga de la prstata, en los hombres. Los ejercicios de Advertising copywriter los msculos del suelo plvico. Estos son los mismos msculos que se aprietan cuando se intenta detener el flujo de orina o evitar expulsar gases. Estos ejercicios se pueden Ecologist est sentado, parado o acostado, pero lo mejor es variar la posicin. Pregunte al mdico qu ejercicios son seguros para usted. Haga los ejercicios exactamente como se lo  haya indicado el mdico y gradelos como se lo hayan indicado. No comience a hacer estos ejercicios hasta que se lo indique el mdico. Ejercicios Cmo hacer los ejercicios de Kegel: Contraiga con fuerza los msculos del suelo plvico. Debe sentir que el rea rectal se eleva y se tensa. Si es [place], tambin debe sentir tensin en el rea vaginal. Mantenga el [place], las nalgas y las piernas [place]. Mantenga los msculos tensos durante 10 segundos como mximo. Respire con normalidad. Relaje los msculos durante un mximo de 10 segundos. Repita como se lo haya indicado el mdico. Repita este ejercicio a diario como se lo haya indicado el mdico. Contine haciendo este ejercicio durante al menos 4 a 6 semanas o el tiempo que le haya indicado su mdico. Pueden derivarlo a un fisioterapeuta que lo puede ayudar a aprender ms sobre cmo hacer los ejercicios de Kegel. Segn sea su estado, el mdico puede recomendarle lo siguiente: Variar cunto tiempo contrae los msculos. Hacer varias series de ejercicios [employer]. Hacer ejercicios durante varias semanas. Convertir los ejercicios de Kegel en parte de su rutina de ejercicios habitual. Esta informacin no tiene Theme park manager el consejo del mdico. Asegrese de hacerle al mdico cualquier pregunta que tenga. Document Revised: 04/18/2021 Document Reviewed: 04/18/2021 Elsevier Patient Education  2024 [employer].

## 2024-09-06 NOTE — Progress Notes (Signed)
 BP (!) 134/92   Pulse 93   Temp 98.7 F (37.1 C)   Ht 5' 0.25 (1.53 m)   Wt 148 lb (67.1 kg)   LMP 08/27/2024 (Approximate)   SpO2 99%   BMI 28.66 kg/m    Subjective:    Patient ID: Desiree Thornton, female    DOB: 1981-06-19, 43 y.o.   MRN: [id number]  HPI: Lorrena Goranson is a 43 y.o. female presenting on 09/06/2024 for Gynecologic Exam and Hypertension   HPI   LMP 08/27/24.  Some irregularness of menses  Some uterine prolapse recently  She has 4 children and 1 miscarrage.  All vaginal births   Relevant past medical, surgical, family and social history reviewed and updated as indicated. Interim medical history since our last visit reviewed. Allergies and medications reviewed and updated.  CURRENT MEDS: Amlodipine  5mg  iron   Review of Systems  Per HPI unless specifically indicated above     Objective:    BP (!) 134/92   Pulse 93   Temp 98.7 F (37.1 C)   Ht 5' 0.25 (1.53 m)   Wt 148 lb (67.1 kg)   LMP 08/27/2024 (Approximate)   SpO2 99%   BMI 28.66 kg/m   Wt Readings from Last 3 Encounters:  09/06/24 148 lb (67.1 kg)  08/10/24 149 lb (67.6 kg)  07/05/24 151 lb (68.5 kg)    Physical Exam Vitals and nursing note reviewed. Exam conducted with a chaperone present.  Constitutional:      General: She is not in acute distress.    Appearance: She is well-developed. She is not toxic-appearing.  HENT:     Head: Normocephalic and atraumatic.  Pulmonary:     Effort: Pulmonary effort is normal.  Chest:  Breasts:    Right: Normal.     Left: Normal.  Abdominal:     Palpations: Abdomen is soft. There is no mass.     Tenderness: There is no abdominal tenderness. There is no guarding or rebound.  Genitourinary:    Labia:        Right: No rash, tenderness or lesion.        Left: No rash, tenderness or lesion.      Vagina: Normal.     Cervix: No cervical motion tenderness or discharge.     Uterus: Normal.      Adnexa:         Right: No mass, tenderness or fullness.         Left: No mass, tenderness or fullness.       Comments: (Nurse Berenice assisted) Some blood visible at [facility] Musculoskeletal:     Right lower leg: No edema.     Left lower leg: No edema.  Skin:    General: Skin is warm and dry.  Neurological:     Mental Status: She is alert and oriented to person, place, and time.  Psychiatric:        Behavior: Behavior normal. Behavior is cooperative.            Assessment & Plan:    Encounter Diagnoses  Name Primary?   Primary hypertension Yes   Encounter for routine gynecological examination with Papanicolaou smear of cervix    Anemia, unspecified type    Not proficient in [place] language        -pt encouraged to get labs drawn.  She will be called with results -pt Declined flu shot -continue amlodipine  for htn -pt counseled on prolapse.  She will do  kegel exercises and re-eval at OV in two months.  She is to contact office sooner prn

## 2024-09-08 ENCOUNTER — Ambulatory Visit: Payer: Self-pay | Admitting: Physician Assistant

## 2024-09-08 LAB — CYTOLOGY - PAP
Comment: NEGATIVE
Diagnosis: NEGATIVE
Diagnosis: REACTIVE
High risk HPV: NEGATIVE

## 2024-09-13 ENCOUNTER — Ambulatory Visit: Payer: Self-pay | Admitting: Physician Assistant

## 2024-10-24 ENCOUNTER — Other Ambulatory Visit: Payer: Self-pay | Admitting: Physician Assistant

## 2024-10-24 DIAGNOSIS — I10 Essential (primary) hypertension: Secondary | ICD-10-CM

## 2024-10-24 DIAGNOSIS — E875 Hyperkalemia: Secondary | ICD-10-CM

## 2024-10-24 DIAGNOSIS — D649 Anemia, unspecified: Secondary | ICD-10-CM

## 2024-11-08 ENCOUNTER — Ambulatory Visit: Payer: Self-pay | Admitting: Physician Assistant

## 2024-11-15 ENCOUNTER — Encounter: Payer: Self-pay | Admitting: Physician Assistant

## 2024-11-15 ENCOUNTER — Ambulatory Visit: Payer: Self-pay | Admitting: Physician Assistant

## 2024-11-15 ENCOUNTER — Other Ambulatory Visit (HOSPITAL_COMMUNITY)
Admission: RE | Admit: 2024-11-15 | Discharge: 2024-11-15 | Disposition: A | Payer: Self-pay | Source: Ambulatory Visit | Attending: Physician Assistant | Admitting: Physician Assistant

## 2024-11-15 VITALS — BP 148/94 | HR 77 | Temp 97.2°F | Ht 60.25 in | Wt 146.5 lb

## 2024-11-15 DIAGNOSIS — B354 Tinea corporis: Secondary | ICD-10-CM

## 2024-11-15 DIAGNOSIS — N814 Uterovaginal prolapse, unspecified: Secondary | ICD-10-CM

## 2024-11-15 DIAGNOSIS — D649 Anemia, unspecified: Secondary | ICD-10-CM

## 2024-11-15 DIAGNOSIS — I10 Essential (primary) hypertension: Secondary | ICD-10-CM

## 2024-11-15 DIAGNOSIS — E875 Hyperkalemia: Secondary | ICD-10-CM | POA: Insufficient documentation

## 2024-11-15 DIAGNOSIS — Z789 Other specified health status: Secondary | ICD-10-CM

## 2024-11-15 LAB — BASIC METABOLIC PANEL WITH GFR
Anion gap: 7 (ref 5–15)
BUN: 10 mg/dL (ref 6–20)
CO2: 27 mmol/L (ref 22–32)
Calcium: 8.7 mg/dL — ABNORMAL LOW (ref 8.9–10.3)
Chloride: 104 mmol/L (ref 98–111)
Creatinine, Ser: 0.57 mg/dL (ref 0.44–1.00)
GFR, Estimated: >60 mL/min (ref >60)
Glucose, Bld: 99 mg/dL (ref 70–99)
Potassium: 4.2 mmol/L (ref 3.5–5.1)
Sodium: 139 mmol/L (ref 135–145)

## 2024-11-15 LAB — CBC
HCT: 36.7 % (ref 36.0–46.0)
Hemoglobin: 11.7 g/dL — ABNORMAL LOW (ref 12.0–15.0)
MCH: 26 pg (ref 26.0–34.0)
MCHC: 31.9 g/dL (ref 30.0–36.0)
MCV: 81.6 fL (ref 80.0–100.0)
Platelets: 371 K/uL (ref 150–400)
RBC: 4.5 MIL/uL (ref 3.87–5.11)
RDW: 17.8 % — ABNORMAL HIGH (ref 11.5–15.5)
WBC: 4.1 K/uL (ref 4.0–10.5)
nRBC: 0 % (ref 0.0–0.2)

## 2024-11-15 MED ORDER — CLOTRIMAZOLE-BETAMETHASONE 1-0.05 % EX CREA: 1.0000 | TOPICAL_CREAM | Freq: Two times a day (BID) | CUTANEOUS | Status: AC

## 2024-11-15 NOTE — Progress Notes (Signed)
 BP (!) 148/94   Pulse 77   Temp (!) 97.2 F (36.2 C)   Ht 5' 0.25 (1.53 m)   Wt 146 lb 8 oz (66.5 kg)   SpO2 99%   BMI 28.37 kg/m    Subjective:    Patient ID: Desiree Thornton, female    DOB: [birth year], 43 y.o.   MRN: [id number]  HPI: Desiree Thornton is a 43 y.o. female presenting on 11/15/2024 for Hypertension (Pt requests a work note)   HPI  Chief Complaint  Patient presents with   Hypertension    Pt requests a work note      She is still having a lot of problems with uterine prolapse.   Pt was examined at previous OV and counseled on Kegel exercises.   Pt has had four children and one miscarrage     She says she sometimes forgets her bp medication.     Relevant past medical, surgical, family and social history reviewed and updated as indicated. Interim medical history since our last visit reviewed. Allergies and medications reviewed and updated.    Current Outpatient Medications:    amLODipine  (NORVASC ) 5 MG tablet, Take 1 tablet (5 mg total) by mouth daily. Tome una tableta por boca diaria, Disp: 90 tablet, Rfl: 1   Iron , Ferrous Sulfate , 325 (65 Fe) MG TABS, Take 325 mg by mouth daily., Disp: 90 tablet, Rfl: 1   albuterol  (VENTOLIN  HFA) 108 (90 Base) MCG/ACT inhaler, Inhale 2 puffs into the lungs every 6 (six) hours as needed for wheezing or shortness of breath. (Patient not taking: Reported on 11/15/2024), Disp: 1 each, Rfl: 1   clotrimazole -betamethasone  (LOTRISONE ) cream, Apply 1 Application topically 2 (two) times daily. (Patient not taking: Reported on 11/15/2024), Disp: 15 g, Rfl: 1   Review of Systems  Per HPI unless specifically indicated above     Objective:    BP (!) 148/94   Pulse 77   Temp (!) 97.2 F (36.2 C)   Ht 5' 0.25 (1.53 m)   Wt 146 lb 8 oz (66.5 kg)   SpO2 99%   BMI 28.37 kg/m   Wt Readings from Last 3 Encounters:  11/15/24 146 lb 8 oz (66.5 kg)  09/06/24 148 lb (67.1 kg)  08/10/24 149 lb (67.6 kg)     Physical Exam Vitals reviewed.  Constitutional:      General: She is not in acute distress.    Appearance: She is well-developed. She is not toxic-appearing.  HENT:     Head: Normocephalic and atraumatic.  Cardiovascular:     Rate and Rhythm: Normal rate and regular rhythm.  Pulmonary:     Effort: Pulmonary effort is normal.     Breath sounds: Normal breath sounds.  Abdominal:     General: Bowel sounds are normal.     Palpations: Abdomen is soft. There is no mass.     Tenderness: There is no abdominal tenderness.  Musculoskeletal:     Cervical back: Neck supple.     Right lower leg: No edema.     Left lower leg: No edema.  Lymphadenopathy:     Cervical: No cervical adenopathy.  Skin:    General: Skin is warm and dry.     Findings: Rash present.     Comments: Rash RLE anterior shin distal- well demarcated but improved from previous exam  Neurological:     Mental Status: She is alert and oriented to person, place, and time.  Psychiatric:  Behavior: Behavior normal.     Results for orders placed or performed during the hospital encounter of 11/15/24  CBC   Collection Time: 11/15/24  8:24 AM  Result Value Ref Range   WBC 4.1 4.0 - 10.5 K/uL   RBC 4.50 3.87 - 5.11 MIL/uL   Hemoglobin 11.7 (L) 12.0 - 15.0 g/dL   HCT 63.2 63.9 - 53.9 %   MCV 81.6 80.0 - 100.0 fL   MCH 26.0 26.0 - 34.0 pg   MCHC 31.9 30.0 - 36.0 g/dL   RDW 82.1 (H) 88.4 - 84.4 %   Platelets 371 150 - 400 K/uL   nRBC 0.0 0.0 - 0.2 %  Basic metabolic panel with GFR   Collection Time: 11/15/24  8:24 AM  Result Value Ref Range   Sodium 139 135 - 145 mmol/L   Potassium 4.2 3.5 - 5.1 mmol/L   Chloride 104 98 - 111 mmol/L   CO2 27 22 - 32 mmol/L   Glucose, Bld 99 70 - 99 mg/dL   BUN 10 6 - 20 mg/dL   Creatinine, Ser 9.42 0.44 - 1.00 mg/dL   Calcium 8.7 (L) 8.9 - 10.3 mg/dL   GFR, Estimated >39 >39 mL/min   Anion gap 7 5 - 15      Assessment & Plan:   Encounter Diagnoses  Name Primary?    Primary hypertension Yes   Anemia, unspecified type    Tinea corporis    Uterine prolapse    Not proficient in English language     -pt would like referral to gyn for uterine prolapse.  She is given application for [facility] [employer] financial assistance -Reviewed labs with pt -pt was given another sample lotrisone  to use on her tinea -discussed strategies to help pt remember her medication including setting an alarm on her cell phone -pt to RTO to Recheck bp 1 month.  She is to contact office sooner prn

## 2024-12-20 ENCOUNTER — Ambulatory Visit: Payer: Self-pay | Admitting: Physician Assistant

## 2025-01-05 ENCOUNTER — Encounter: Payer: Self-pay | Admitting: Obstetrics & Gynecology

## 2025-01-05 ENCOUNTER — Ambulatory Visit: Payer: Self-pay | Admitting: Obstetrics & Gynecology

## 2025-01-05 ENCOUNTER — Encounter: Payer: Self-pay | Admitting: Physician Assistant

## 2025-01-05 ENCOUNTER — Ambulatory Visit: Payer: Self-pay | Admitting: Physician Assistant

## 2025-01-05 VITALS — BP 138/91 | HR 77 | Temp 97.2°F

## 2025-01-05 VITALS — BP 192/120 | HR 78 | Ht 60.25 in | Wt 147.0 lb

## 2025-01-05 DIAGNOSIS — I10 Essential (primary) hypertension: Secondary | ICD-10-CM

## 2025-01-05 DIAGNOSIS — D250 Submucous leiomyoma of uterus: Secondary | ICD-10-CM

## 2025-01-05 DIAGNOSIS — N921 Excessive and frequent menstruation with irregular cycle: Secondary | ICD-10-CM

## 2025-01-05 DIAGNOSIS — Z789 Other specified health status: Secondary | ICD-10-CM

## 2025-01-05 DIAGNOSIS — D259 Leiomyoma of uterus, unspecified: Secondary | ICD-10-CM

## 2025-01-05 DIAGNOSIS — Z3009 Encounter for other general counseling and advice on contraception: Secondary | ICD-10-CM

## 2025-01-05 DIAGNOSIS — N946 Dysmenorrhea, unspecified: Secondary | ICD-10-CM

## 2025-01-05 DIAGNOSIS — N939 Abnormal uterine and vaginal bleeding, unspecified: Secondary | ICD-10-CM

## 2025-01-05 MED ORDER — AMLODIPINE BESYLATE 10 MG PO TABS: 10.0000 mg | ORAL_TABLET | Freq: Every day | ORAL | 0 refills | Status: AC

## 2025-01-05 NOTE — Progress Notes (Signed)
" ° °  BP (!) 138/91   Pulse 77   Temp (!) 97.2 F (36.2 C)   LMP 11/14/2024   SpO2 98%    Subjective:    Patient ID: Desiree Thornton, female    DOB: 01/26/1981, 44 y.o.   MRN: [id number]  HPI: Desiree Thornton is a 44 y.o. female presenting on 01/05/2025 for Hypertension   HPI   Chief Complaint  Patient presents with   Hypertension     Pt was a no-show to her January appointment to follow up htn but she went to gyn appointment this morning and bp was elevated.   She feels well.  She denies HA, cp, vision changes.    She says she submitted her cafa application with [employer].     Relevant past medical, surgical, family and social history reviewed and updated as indicated. Interim medical history since our last visit reviewed. Allergies and medications reviewed and updated.   Current Outpatient Medications:    amLODipine  (NORVASC ) 5 MG tablet, Take 1 tablet (5 mg total) by mouth daily. Tome una tableta por boca diaria, Disp: 90 tablet, Rfl: 1   clotrimazole -betamethasone  (LOTRISONE ) cream, Apply 1 Application topically 2 (two) times daily., Disp: , Rfl:    Iron , Ferrous Sulfate , 325 (65 Fe) MG TABS, Take 325 mg by mouth daily., Disp: 90 tablet, Rfl: 1    Review of Systems  Per HPI unless specifically indicated above     Objective:    BP (!) 138/91   Pulse 77   Temp (!) 97.2 F (36.2 C)   LMP 11/14/2024   SpO2 98%   Wt Readings from Last 3 Encounters:  01/05/25 147 lb (66.7 kg)  11/15/24 146 lb 8 oz (66.5 kg)  09/06/24 148 lb (67.1 kg)    Physical Exam Vitals reviewed.  Constitutional:      General: She is not in acute distress.    Appearance: She is well-developed. She is not toxic-appearing.  HENT:     Head: Normocephalic and atraumatic.  Cardiovascular:     Rate and Rhythm: Normal rate and regular rhythm.  Pulmonary:     Effort: Pulmonary effort is normal.     Breath sounds: Normal breath sounds.  Abdominal:     General:  Bowel sounds are normal.     Palpations: Abdomen is soft. There is no mass.     Tenderness: There is no abdominal tenderness.  Musculoskeletal:     Cervical back: Neck supple.     Right lower leg: No edema.     Left lower leg: No edema.  Lymphadenopathy:     Cervical: No cervical adenopathy.  Skin:    General: Skin is warm and dry.  Neurological:     Mental Status: She is alert and oriented to person, place, and time.  Psychiatric:        Behavior: Behavior normal.           Assessment & Plan:    Encounter Diagnoses  Name Primary?   Primary hypertension Yes   Not proficient in English language       Increase amlodipine .   Discussed need to change bp meds if she decides to get pregnant again.  Pt to follow up one month for recheck.  She is to contact office sooner prn  "

## 2025-01-05 NOTE — Patient Instructions (Signed)
 Embarazo despus de los 35 aos: Cmo manejarlo Pregnancy After Age 43: How to Manage Las mujeres que quedan embarazadas despus de los 35 aos pueden tener ms problemas durante el embarazo. Estos problemas pueden afectar a la madre y al beb en gestacin. Qu me puede pasar si tengo ms de 35 aos? Es posible que le resulte ms difcil quedar embarazada despus de los [address]. Si queda embarazada, es probable que: Quede embarazada de ms de un beb. Entre en [place] de parto antes de [place]. Tenga presin arterial alta durante el embarazo. Tenga diabetes durante el embarazo. Pierda al beb en gestacin. Que puede ocurrirle al beb? Los bebs cuyas madres tienen ms de 35 aos de edad corren un riesgo mayor de que suceda lo siguiente: Nacer antes de tiempo. Bajo peso al nacer. Problemas con los que una persona nace, como sndrome de Down y engineer, petroleum. Cmo me cuido si estoy embarazada?  Reciba buen cuidado prenatal Todas las mujeres deben visitar al mdico antes de intentar quedar embarazadas. Esto es ms importante para las mujeres mayores de [address]. Durante las visitas prenatales, informe a su mdico sobre lo siguiente: Cualquier problema de salud que tenga. Los medicamentos que usa . Cualquier problema de salud que tengan sus familiares. Problemas que tiene de nacimiento (congnitos). Cualquier problema que haya tenido con cirugas, embarazos o partos anteriores. Pregntele al mdico sobre cualquier prueba que pueda necesitar antes y despus de quedar embarazada. Tome medidas para mantenerse sana durante el embarazo Mantenerse sana puede ayudar a software engineer de que haya problemas durante el embarazo y [place]. Es posible que le indiquen que: Tome sus medicamentos nicamente segn las indicaciones. Comience a tomar un multivitamnico diario un mes antes de intentar quedar embarazada o ms. El multivitamnico debe contener 400 mcg de cido flico. Siga tomando el  multivitamnico despus de quedar embarazada, a menos que el mdico le diga que no lo haga. Siga una dieta saludable. Beba ms lquido segn se lo hayan indicado. No se d baos de inmersin en agua caliente, baos turcos ni saunas. No fume, vapee ni consuma nicotina o tabaco. No beba alcohol ni consuma drogas o sustancias ilegales. Si necesita ayuda para dejar de fumar, consulte al mdico. Concurra a todas las visitas de seguimiento. El mdico llevar un control de su salud y del crecimiento del beb. Esta informacin no tiene theme park manager el consejo del mdico. Asegrese de hacerle al mdico cualquier pregunta que tenga. Document Revised: 06/01/2023 Document Reviewed: 06/01/2023 Elsevier Patient Education  2024 [employer].

## 2025-01-05 NOTE — Progress Notes (Signed)
" ° °  GYN VISIT Patient name: Desiree Thornton MRN [id number]  Date of birth: 02/25/1981 Chief Complaint:   Vaginal Prolapse  History of Present Illness:   Desiree Thornton is a 44 y.o. [phone] female being seen today for the following concerns:  When she strains to go to the bathroom or lifts something heavy, notes a ball.  Menses have been regular the past 2 mos, but previously had irregular bleeding- sometimes it was every other week, other times it may have been late.  Typically 5 days with moderate to heavy bleeding using 6 or 7 pads per day.  + dysmenorrhea  Desires another pregnancy  Last baby 2016, sexually active with same partner  + Stress incontinence, ?urinary frequency, denies urge incontinence, usually up once at night void    Patient's last menstrual period was 11/14/2024.    Review of Systems:   Pertinent items are noted in HPI Denies fever/chills, dizziness, headaches, visual disturbances, fatigue, shortness of breath, chest pain, abdominal pain, vomiting Pertinent History Reviewed:   Past Surgical History:  Procedure Laterality Date   NO PAST SURGERIES      Past Medical History:  Diagnosis Date   Bronchitis    Hypertension    Reviewed problem list, medications and allergies. Physical Assessment:   Vitals:   01/05/25 0835 01/05/25 0913  BP: (!) 166/103 (!) 192/120  Pulse: 76 78  Weight: 147 lb (66.7 kg)   Height: 5' 0.25 (1.53 m)   Body mass index is 28.47 kg/m.       Physical Examination:   General appearance: alert, well appearing, and in no distress  Psych: mood appropriate, normal affect  Skin: warm & dry   Cardiovascular: normal heart rate noted  Respiratory: normal respiratory effort, no distress  Abdomen: soft, non-tender, no rebound or guarding  Pelvic: VULVA: normal appearing vulva with no masses, tenderness or lesions, VAGINA: normal appearing vagina with normal color and discharge, no lesions.  Large  pendculated fibroid noted extending through cervix- unable to visualize cervix.  On bimanual exam- able to fell thick uterine stalk  Extremities: no edema   Chaperone: Sonia-Spanish interpreter present throughout entire visit today  Assessment & Plan:  1) Uterine fibroid, AUB -Reviewed findings on exam and discussed that patient does not have prolapse, but rather uterine fibroid protruding through cervix - While there are many components to infertility and AUB, anticipate that this fibroid is part of the issue -Recommendation for surgical intervention.  Discussed myomectomy reviewed risk benefit including but not limited to risk of bleeding and infection - Patient's and concerns were addressed.  Patient is concerned about cost- plan to apply for fin aid []  once approved will plan to schedule surgery  2) chronic HTN -Patient has not yet taken her medicine, she usually takes it in the p.m. - Advised to take medicine when she went home and to contact her PCP as anticipated medication needs to be adjusted   No orders of the defined types were placed in this encounter.   Return for TBD.   [place], DO Attending Obstetrician & Gynecologist, [facility] for [facility], [facility]    "

## 2025-02-02 ENCOUNTER — Ambulatory Visit: Payer: Self-pay | Admitting: Physician Assistant
